# Patient Record
Sex: Female | Born: 1970 | Race: Black or African American | Hispanic: No | Marital: Married | Smoking: Never smoker
Health system: Southern US, Community
[De-identification: ages and names within clinical notes are randomized; demographics above are authoritative.]

## PROBLEM LIST (undated history)

## (undated) DIAGNOSIS — E785 Hyperlipidemia, unspecified: Secondary | ICD-10-CM

## (undated) DIAGNOSIS — R7303 Prediabetes: Secondary | ICD-10-CM

## (undated) DIAGNOSIS — I10 Essential (primary) hypertension: Secondary | ICD-10-CM

## (undated) DIAGNOSIS — E611 Iron deficiency: Secondary | ICD-10-CM

## (undated) DIAGNOSIS — Z8742 Personal history of other diseases of the female genital tract: Secondary | ICD-10-CM

## (undated) DIAGNOSIS — Z8619 Personal history of other infectious and parasitic diseases: Secondary | ICD-10-CM

## (undated) DIAGNOSIS — B379 Candidiasis, unspecified: Secondary | ICD-10-CM

## (undated) HISTORY — DX: Candidiasis, unspecified: B37.9

## (undated) HISTORY — PX: SALPINGECTOMY: SHX328

## (undated) HISTORY — DX: Iron deficiency: E61.1

## (undated) HISTORY — DX: Personal history of other diseases of the female genital tract: Z87.42

## (undated) HISTORY — DX: Personal history of other infectious and parasitic diseases: Z86.19

## (undated) HISTORY — DX: Hyperlipidemia, unspecified: E78.5

## (undated) HISTORY — DX: Essential (primary) hypertension: I10

## (undated) HISTORY — DX: Prediabetes: R73.03

---

## 2001-02-08 ENCOUNTER — Other Ambulatory Visit: Admission: RE | Admit: 2001-02-08 | Discharge: 2001-02-08 | Payer: Self-pay | Admitting: Obstetrics and Gynecology

## 2004-10-02 ENCOUNTER — Encounter: Admission: RE | Admit: 2004-10-02 | Discharge: 2004-10-14 | Payer: Self-pay | Admitting: Family Medicine

## 2006-02-09 ENCOUNTER — Emergency Department (HOSPITAL_COMMUNITY): Admission: EM | Admit: 2006-02-09 | Discharge: 2006-02-09 | Payer: Self-pay | Admitting: Emergency Medicine

## 2006-05-17 ENCOUNTER — Other Ambulatory Visit: Admission: RE | Admit: 2006-05-17 | Discharge: 2006-05-17 | Payer: Self-pay | Admitting: *Deleted

## 2007-09-14 HISTORY — PX: BREAST REDUCTION SURGERY: SHX8

## 2007-10-24 ENCOUNTER — Inpatient Hospital Stay (HOSPITAL_COMMUNITY): Admission: AD | Admit: 2007-10-24 | Discharge: 2007-10-28 | Payer: Self-pay | Admitting: Obstetrics and Gynecology

## 2007-10-25 ENCOUNTER — Encounter (INDEPENDENT_AMBULATORY_CARE_PROVIDER_SITE_OTHER): Payer: Self-pay | Admitting: Obstetrics and Gynecology

## 2008-07-10 ENCOUNTER — Encounter: Admission: RE | Admit: 2008-07-10 | Discharge: 2008-07-10 | Payer: Self-pay | Admitting: Plastic Surgery

## 2008-07-31 ENCOUNTER — Ambulatory Visit (HOSPITAL_BASED_OUTPATIENT_CLINIC_OR_DEPARTMENT_OTHER): Admission: RE | Admit: 2008-07-31 | Discharge: 2008-07-31 | Payer: Self-pay | Admitting: Plastic Surgery

## 2008-07-31 ENCOUNTER — Encounter (INDEPENDENT_AMBULATORY_CARE_PROVIDER_SITE_OTHER): Payer: Self-pay | Admitting: Plastic Surgery

## 2010-01-28 ENCOUNTER — Emergency Department (HOSPITAL_COMMUNITY): Admission: EM | Admit: 2010-01-28 | Discharge: 2010-01-28 | Payer: Self-pay | Admitting: Family Medicine

## 2010-05-15 ENCOUNTER — Other Ambulatory Visit: Payer: Self-pay | Admitting: Obstetrics and Gynecology

## 2010-05-18 ENCOUNTER — Inpatient Hospital Stay (HOSPITAL_COMMUNITY): Admission: AD | Admit: 2010-05-18 | Discharge: 2010-05-21 | Payer: Self-pay | Admitting: Obstetrics and Gynecology

## 2010-07-10 ENCOUNTER — Emergency Department (HOSPITAL_COMMUNITY): Admission: EM | Admit: 2010-07-10 | Discharge: 2010-07-10 | Payer: Self-pay | Admitting: Family Medicine

## 2010-11-26 LAB — CBC
HCT: 31.1 % — ABNORMAL LOW (ref 36.0–46.0)
HCT: 32.8 % — ABNORMAL LOW (ref 36.0–46.0)
MCH: 27 pg (ref 26.0–34.0)
MCH: 27 pg (ref 26.0–34.0)
MCV: 81.8 fL (ref 78.0–100.0)
Platelets: 276 10*3/uL (ref 150–400)
Platelets: 282 10*3/uL (ref 150–400)
RBC: 3.97 MIL/uL (ref 3.87–5.11)
RBC: 4.07 MIL/uL (ref 3.87–5.11)
WBC: 6.6 10*3/uL (ref 4.0–10.5)
WBC: 7.9 10*3/uL (ref 4.0–10.5)

## 2010-11-26 LAB — COMPREHENSIVE METABOLIC PANEL
Albumin: 3.1 g/dL — ABNORMAL LOW (ref 3.5–5.2)
BUN: 7 mg/dL (ref 6–23)
CO2: 21 mEq/L (ref 19–32)
Chloride: 110 mEq/L (ref 96–112)
GFR calc Af Amer: >60 mL/min (ref >60)
Sodium: 139 mEq/L (ref 135–145)
Total Bilirubin: 0.2 mg/dL — ABNORMAL LOW (ref 0.3–1.2)
Total Protein: 6.4 g/dL (ref 6.0–8.3)

## 2010-11-26 LAB — URIC ACID: Uric Acid, Serum: 5 mg/dL (ref 2.4–7.0)

## 2010-11-26 LAB — SURGICAL PCR SCREEN: Staphylococcus aureus: NEGATIVE

## 2010-11-26 LAB — RPR: RPR Ser Ql: NONREACTIVE

## 2010-11-26 LAB — LACTATE DEHYDROGENASE: LDH: 154 U/L (ref 94–250)

## 2010-11-30 LAB — CULTURE, ROUTINE-ABSCESS

## 2011-01-26 NOTE — Op Note (Signed)
[name], [name]            ACCOUNT NO.:  [id number]   MEDICAL RECORD NO.:  [id number]          PATIENT TYPE:  INP   LOCATION:  9315                          FACILITY:  [facility]   PHYSICIAN:  Osborn Coho, M.D.   DATE OF BIRTH:  1970-11-29   DATE OF PROCEDURE:  10/25/2007  DATE OF DISCHARGE:                               OPERATIVE REPORT   PREOPERATIVE DIAGNOSES:  1. Intrauterine pregnancy at 38-5/7 weeks.  2. Spontaneous rupture of membranes.  3. Breech presentation.   POSTOPERATIVE DIAGNOSIS:  1. Intrauterine pregnancy at 38-5/7 weeks.  2. Spontaneous rupture of membranes.  3. Breech presentation.   PROCEDURE:  Primary low transverse C-section.   ANESTHESIA:  Spinal.   SURGEON:  Osborn Coho, M.D.   ASSISTANT:  Philipp Deputy, C.N.M.   FLUIDS:  2400 mL.   URINE OUTPUT:  300 mL of clear urine.   ESTIMATED BLOOD LOSS:  800 mL.   COMPLICATIONS:  None.   SPECIMENS TO PATHOLOGY:  Placenta.   FINDINGS:  Live female infant with Apgars of 9 at one minute and 10 at 5  minutes.  Normal-appearing bilateral ovaries and fallopian tubes.   PROCEDURE:  The patient is taken to the operating room after risks,  benefits, alternatives reviewed with the patient.  The patient  verbalized understanding and consent signed and witnessed.  The patient  was given a spinal per anesthesia and prepped and draped in normal  sterile fashion.  A Pfannenstiel skin incision was made and carried down  to the underlying layer of fascia with the scalpel and Bovie.  The  fascia was excised bilaterally in the midline, extended bilaterally with  the Mayo scissors.  Kocher clamps were placed on the inferior aspect of  fascial incision and the rectus muscle excised from the fascia.  The  same was done on the superior aspect of fascial incision.  The rectus  muscle was separated in midline and peritoneum entered bluntly and  extended manually.  Bladder blade was placed and bladder flap created  with the  Metzenbaum scissors.  Uterine incision was made with a scalpel  and extended bilaterally with the bandage scissors.  Infant was  delivered in frank breech presentation via breech extraction and nuchal  cord x1 noted.  Oropharynx was bulb suctioned.  Cord was clamped and cut  and infant handed to the waiting pediatricians.  Cord bloods were  collected.  The placenta was removed via fundal massage and uterus  cleared of all clots and debris.  The incision was repaired with 0  Vicryl in a running locked fashion.  A second imbricating layer was  performed.  The intra-abdominal cavity was copiously irrigated and the  uterine incision was noted to be hemostatic.  Normal appearing bilateral  ovaries and fallopian tubes noted.  The peritoneum was  repaired with 2-  0 chromic in a running fashion.  The fascia was repaired with 0 Vicryl  in a running fashion.  The subcutaneous tissue was irrigated and made  hemostatic with the Bovie and was reapproximated using four interrupted  stitches of 2-0 plain.  The skin was reapproximated using  3-0 Monocryl  via a subcuticular stitch.  Half-inch Steri-Strips were applied with  Benzoin.  Sponge, lap and needle count was correct.  The patient  tolerated procedure well and is awaiting transfer to the recovery room  in good condition.  October 25, 2007      Osborn Coho, M.D.  Electronically Signed     AR/MEDQ  D:  10/25/2007  T:  10/26/2007  Job:  [id number]

## 2011-01-26 NOTE — H&P (Signed)
Jody [name], Jody [name]            ACCOUNT NO.:  [id number]   MEDICAL RECORD NO.:  [id number]          PATIENT TYPE:  MAT   LOCATION:  MATC                          [facility]  [facility]   PHYSICIAN:  Osborn Coho, M.D.   DATE OF BIRTH:  Sep 05, 1971   DATE OF ADMISSION:  10/24/2007  DATE OF DISCHARGE:                              HISTORY & PHYSICAL   Jody [name] is a 40 year old married black female prima gravida at 66-  5/7 weeks who presents with leaking fluid since 11 p.m. on October 24, 2007.  She denies contractions or bleeding, no signs or symptoms of PIH.  She reports fetus being breach at her last office visit.  Her pregnancy  has been followed by [facility] MD service.  Has been  remarkable for:   1. Advanced maternal age.  2. First trimester medication exposure.  3. Group B strep negative.   Her prenatal labs were collected on April 14, 2007.  Hemoglobin 12,  hematocrit 36.3, platelets 319,000.  Blood group A  positive, antibody  negative, RPR nonreactive, rubella immune, hepatitis B surface antigen  negative, HIV nonreactive.  Pap smear within normal limits.  Quad screen  from May 09, 2007 was within normal limits.  1-hour Glucola from  August 03, 2007 was 155.  Hemoglobin at that time was 11.1.  Three  hour glucose tolerance test from August 14, 2007 was within normal  limits.  Culture of the vaginal tract for group B strep on October 03, 2007 was negative.   HISTORY OF PRESENT PREGNANCY:  Patient presented for care at [facility] on April 14, 2007 at 11-1/7 weeks' gestation.  She declined  amniocentesis as well as first trimester screen.  She did have a quad  screen drawn which was normal.  Anatomy ultrasound at 20 weeks'  gestation shows growth consistent with previous dating confirming [facility] of  November 03, 2007.  1-hour Glucola was elevated requiring a three hour  glucose tolerance test which was within normal limits.  At patient's  office  visit at 37-38 weeks' gestation, fetus was noted to be in the  breech presentation.  Rest of her prenatal care was unremarkable.   OBSTETRIC HISTORY:  She is a primigravida.   ALLERGIES:  She has no medication allergies.   GYNECOLOGICAL HISTORY:  She experienced menarche at the age of 68 with  28 day cycles lasting seven days.  She has used NuvaRing in the past for  contraception and discontinued that in January 2008.  She reports having  had the usual childhood illnesses.  She had cystitis in August 2006.   PAST SURGICAL HISTORY:  Negative.   FAMILY MEDICAL HISTORY:  Mother and maternal grandmother with chronic  hypertension, maternal grandmother with varicosities, mother with non-  insulin-dependent diabetes.   GENETIC HISTORY:  Father of the baby's mother is a twin.  Patient is at  the age of 75 at time of delivery.   SOCIAL HISTORY:  The patient is married to the father of the baby.  His  name is Jody [name].  He is involved and supportive.  They  are of the [facility]  faith.  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  Father of the baby has 21 years of education and is a  Scientist, physiological.  They deny any alcohol, tobacco or illicit drug use  at positive pregnancy test, although patient did take medication called  phentermine up until February 23, 2007.   OBJECTIVE DATA:  VITAL SIGNS:  Stable.  She is afebrile.  HEENT:  Grossly within normal limits.  CHEST:  Clear to auscultation.  HEART:  Regular rate and rhythm.  ABDOMEN:  Gravid in contour with fundal height extending approximately  39 cm above pubic symphysis.  Fetal heart rate is reactive and  reassuring.  There are no contractions.  PELVIC:  Deferred.  The patient is leaking copious clear fluid that is  positive for Nitrazine and positive for ferning.  EXTREMITIES:  Within normal limits.  Formal ultrasound shows complete  breech presentation with AFI of 3%.   ASSESSMENT:  1. Intrauterine  pregnancy at term.  2. Breech.  3. Premature rupture of membranes.   PLAN:  Admit to the operating room and prepare for cesarean section.  Questions have been answered, and risks and benefits were reviewed by  myself as well as Dr. Osborn Coho.      Cam Hai, C.N.M.      Osborn Coho, M.D.  Electronically Signed    KS/MEDQ  D:  10/25/2007  T:  10/25/2007  Job:  [id number]

## 2011-01-26 NOTE — Discharge Summary (Signed)
Jody [name], Jody [name]            ACCOUNT NO.:  [id number]   MEDICAL RECORD NO.:  [id number]          PATIENT TYPE:  INP   LOCATION:  9315                          [facility]ACILITY:  [facility]   PHYSICIAN:  Crist Fat. Rivard, M.D. DATE OF BIRTH:  03/05/1971   DATE OF ADMISSION:  10/24/2007  DATE OF DISCHARGE:  10/28/2007                               DISCHARGE SUMMARY   ADMISSION DIAGNOSES:  1. Intrauterine pregnancy at 109 and 5/7 weeks.  2. Spontaneous rupture of membranes.  3. Breech presentation.   DISCHARGE DIAGNOSES:  1. Intrauterine pregnancy at 25 and 5/7 weeks, delivered.  2. Breech presentation.  3. Primary low transverse cesarean section.   PROCEDURE:  Primary low transverse cesarean section.   HOSPITAL COURSE:  [name] is a 40 year old gravida 1, para 0, who  presents at 86 and 5/7 weeks with spontaneous ruptured membranes on  October 24, 2007.  Patient's fetus previously had been noted to be in  breech presentation.  Patient denied any signs or symptoms of PIH.  Patient's pregnancy had been remarkable for:  1. Advanced maternal age.  2. Third trimester med exposure.  3. GBS negative.   Upon admission fetal heart rate was noted to be reassuring.  Patient was  noted to have spontaneous rupture of membranes with clear fluid.  Ultrasound revealed complete breech presentation.  Due to breech  presentation, risks, benefits and alternatives of primary low transverse  cesarean section were discussed with the patient, the patient desired to  proceed.  Patient underwent a low transverse cesarean section and  delivered a viable female infant, named Fayrene Fearing, with Apgars of 9 and 10 at  1 and 5 minutes respectively and weight 6 pounds 9 ounces.  Estimated  blood loss 800 mL.  Surgery was uncomplicated.   On postoperative day #1, patient's hemoglobin was noted to be 10.8.  Patient was ambulating without difficulty.  By postoperative day #3,  patient continued to ambulate, void and  tolerate p.o. liquids and solids  without difficulty.  The patient working on breastfeeding with help of  lactation consultants.  Patient was felt to have received the full  benefit of her hospital stay and was therefore discharged home.  Patient  desires to use Mirena IUD for contraception which will be placed at 12  weeks postpartum.  Patient received Depo-Provera 150 mg IM prior to  discharge for contraception prior to placement of Mirena IUD.  Patient  was encouraged to call if she has questions or concerns prior to her 6-  week postpartum visit.   DISCHARGE INSTRUCTIONS:  Per [facility] handout.   DISCHARGE MEDICATIONS:  1. Motrin 600 mg p.o. q.6h. p.r.n. pain.  2. Tylox 1-2 tabs every 4-6h. p.r.n. for pain.  3. Prenatal vitamins 1 tab daily.   DISCHARGE FOLLOWUP:  Will occur in 6 weeks postpartum at [facility].      Rhona Leavens, CNM      Crist Fat Rivard, M.D.  Electronically Signed    NOS/MEDQ  D:  10/28/2007  T:  10/30/2007  Job:  [id number]

## 2011-01-26 NOTE — Op Note (Signed)
NAME:  Jody [name], Jody [name]            ACCOUNT NO.:  [id number]   MEDICAL RECORD NO.:  [id number]          PATIENT TYPE:  AMB   LOCATION:  DSC                          FACILITY:  [facility]   PHYSICIAN:  Brantley Persons, M.D.DATE OF BIRTH:  Nov 24, 1970   DATE OF PROCEDURE:  07/31/2008  DATE OF DISCHARGE:                               OPERATIVE REPORT   PREOPERATIVE DIAGNOSIS:  Bilateral macromastia.   POSTOPERATIVE DIAGNOSIS:  Bilateral macromastia.   PROCEDURE:  Bilateral reduction mammoplasties.   ATTENDING SURGEON:  Brantley Persons, MD   ANESTHESIA:  General.   ANESTHESIOLOGIST:  Judie Petit, MD   ESTIMATED BLOOD LOSS:  150 mL.   FLUID REPLACEMENT:  3750 mL.   URINE OUTPUT:  900 mL.   COMPLICATIONS:  None.   INDICATIONS FOR PROCEDURE:  The patient is a 40 year old African  American female who has bilateral macromastia that is clinically  symptomatic.  She presents to undergo bilateral reduction mammoplasties.   PROCEDURE IN DETAIL:  The patient was marked in the preop holding area  in the pattern of Wise for the future bilateral reduction mammoplasties.  She was then taken back to the OR and placed on the table in supine  position.  After adequate general anesthesia was obtained, the patient's  chest was prepped with Technicare and draped in sterile fashion.  The  base of breasts had been injected with 1% lidocaine with epinephrine.  After adequate hemostasis and anesthesia had taken effect, the procedure  was begun.   Both of the breast reductions were performed in the following similar  manner.  The nipple-areolar complexes were marked with the 45-mm nipple  marker.  This was then incised and skin was de-epithelialized around the  nipple-areolar complex and carried down to the inframammary crease in  inferior pedicle pattern.  Next the medial, superior, and lateral skin  flaps were elevated down to the chest wall.  The excess fat and  glandular tissue were removed  from the inferior pedicle.  The nipple-  areolar complex was examined and found to be pink and viable.  The wound  was irrigated with saline irrigation.  The inferior pedicle was then  centralized using 3-0 Prolene suture.  A #10 JP flat fully fluted drain  was placed into the wound.  The skin flaps were brought together at the  inverted T junction with a 2-0 Prolene suture.  The incisions were  stapled for temporary closure.  The breasts were compared and found to  have good shape and symmetry.  The incisions were then closed from the  medial aspect of the JP drain to the medial aspect of the [facility] incision,  first placing a few 3-0 Monocryl sutures in the dermal layer to loosely  tacked together and then both dermal and cuticular layer were closed in  a single layer using a Quill 2.0 PDO barbed suture.  Lateral to the JP  drain, the incision was closed using 3-0 Monocryl in the dermal layer  followed by 3-0 Monocryl running intracuticular stitch on the skin.   The patient was placed in the upright position.  The future location of  the nipple-areolar complexes was marked on both breast mounds using the  45-mm nipple marker.  The patient was then placed back in the recumbent  position.   Both of the nipple-areolar complexes were then brought out onto the  breast mound in the following similar manner.  The skin was incised as  marked and the tissue removed in full thickness into the subcutaneous  tissues.  The nipple-areolar complex was examined, found to be pink and  viable, then brought out through this aperture and sewn in place using 4-  0 Monocryl in the dermal layer followed by 5-0 Monocryl running  intracuticular stitch on the skin.  The vertical limb of the Wise  pattern had been closed in the dermal layer using a 3-0 Monocryl suture,  so then the cuticular layer was also closed with a 5-0 Monocryl in  continuity from the nipple-areolar complex stitch.  The drain was sewn  in  place using 3-0 nylon suture.  The skin and subcutaneous tissues in  the base of the breasts were then injected with 0.5% Marcaine with  epinephrine to provide a postsurgical anesthetic block.  The incisions  were dressed with benzoin and Steri-Strips and the nipples additionally  with bacitracin ointment and Adaptic.  There were no complications.  The  patient tolerated the procedure well.  The final needle and sponge  counts were reported to be correct at the end of the case.  A 4 x 4s  were placed over the incisions and ABD pads in the axillary areas.  The  patient was then placed into a light postoperative support bra.   The patient was then awakened from general anesthesia and taken to  recovery room in stable condition.  She was recovered without  complications.  The patient went to go home rather than stay overnight  and this was appropriate.  Both the patient and her family were given  proper postoperative wound care instructions including care of the JP  drains.  The patient was then discharged home in the care of her family  in stable condition.  Followup appointment will be Monday in the office.           ______________________________  Brantley Persons, M.D.     MC/MEDQ  D:  07/31/2008  T:  08/01/2008  Job:  [id number]

## 2011-06-04 LAB — CBC
HCT: 31.7 — ABNORMAL LOW
Hemoglobin: 9.7 — ABNORMAL LOW
MCV: 82.2
Platelets: 304
RDW: 14.5
WBC: 7.5

## 2011-06-15 LAB — POCT HEMOGLOBIN-HEMACUE: Hemoglobin: 12.1

## 2011-11-22 ENCOUNTER — Ambulatory Visit: Payer: Self-pay | Admitting: Obstetrics and Gynecology

## 2011-11-25 ENCOUNTER — Ambulatory Visit: Payer: Self-pay | Admitting: Obstetrics and Gynecology

## 2011-12-27 ENCOUNTER — Ambulatory Visit (INDEPENDENT_AMBULATORY_CARE_PROVIDER_SITE_OTHER): Payer: Managed Care, Other (non HMO) | Admitting: Obstetrics and Gynecology

## 2011-12-27 ENCOUNTER — Encounter: Payer: Self-pay | Admitting: Obstetrics and Gynecology

## 2011-12-27 VITALS — BP 110/60 | HR 70 | Resp 18 | Ht 69.0 in | Wt 208.0 lb

## 2011-12-27 DIAGNOSIS — Z1231 Encounter for screening mammogram for malignant neoplasm of breast: Secondary | ICD-10-CM

## 2011-12-27 DIAGNOSIS — K625 Hemorrhage of anus and rectum: Secondary | ICD-10-CM

## 2011-12-27 DIAGNOSIS — Z124 Encounter for screening for malignant neoplasm of cervix: Secondary | ICD-10-CM

## 2011-12-27 LAB — CBC
HCT: 39.2 % (ref 36.0–46.0)
Hemoglobin: 12.8 g/dL (ref 12.0–15.0)
MCV: 82.9 fL (ref 78.0–100.0)
WBC: 5.7 10*3/uL (ref 4.0–10.5)

## 2011-12-27 NOTE — Patient Instructions (Signed)
Rectal Bleeding  Rectal bleeding is when blood passes out of the anus. It is usually a sign that something is wrong. It may not be serious, but it should always be evaluated. Rectal bleeding may present as bright red blood or extremely dark stools. The color may range from dark red or maroon to black (like tar). It is important that the cause of rectal bleeding be identified so treatment can be started and the problem corrected.  CAUSES    Hemorrhoids. These are enlarged (dilated) blood vessels or veins in the anal or rectal area.   Fistulas. Theseare abnormal, burrowing channels that usually run from inside the rectum to the skin around the anus. They can bleed.   Anal fissures. This is a tear in the tissue of the anus. Bleeding occurs with bowel movements.   Diverticulosis. This is a condition in which pockets or sacs project from the bowel wall. Occasionally, the sacs can bleed.   Diverticulitis. Thisis an infection involving diverticulosis of the colon.   Proctitis and colitis. These are conditions in which the rectum, colon, or both, can become inflamed and pitted (ulcerated).   Polyps and cancer. Polyps are non-cancerous (benign) growths in the colon that may bleed. Certain types of polyps turn into cancer.   Protrusion of the rectum. Part of the rectum can project from the anus and bleed.   Certain medicines.   Intestinal infections.   Blood vessel abnormalities.  HOME CARE INSTRUCTIONS   Eat a high-fiber diet to keep your stool soft.   Limit activity.   Drink enough fluids to keep your urine clear or pale yellow.   Warm baths may be useful to soothe rectal pain.   Follow up with your caregiver as directed.  SEEK IMMEDIATE MEDICAL CARE IF:   You develop increased bleeding.   You have black or dark red stools.   You vomit blood or material that looks like coffee grounds.   You have abdominal pain or tenderness.   You have a fever.   You feel weak, nauseous, or you faint.   You have  severe rectal pain or you are unable to have a bowel movement.  MAKE SURE YOU:   Understand these instructions.   Will watch your condition.   Will get help right away if you are not doing well or get worse.  Document Released: 02/19/2002 Document Revised: 08/19/2011 Document Reviewed: 02/14/2011  Exit[employer] Patient Information 2012 [employer].

## 2011-12-27 NOTE — Progress Notes (Signed)
Contraception iud MIRENA placed 11/11 Last pap [id number] Last Mammo11/2009  Last Colonoscopy 2009  Last Dexa Scan  Primary MD Velna Hatchet  Abuse at Home no  Pt complains bright red blood per rectum in the last few months.  She has history of anal fissure.  She denies any abdominal pain, N/v, or history of colon cancer.  Pt had a colonoscopy 2009 which was normal per pt Physical Examination: General appearance - alert, well appearing, and in no distress and oriented to person, place, and time Neck - supple, no significant adenopathy Chest - clear to auscultation, no wheezes, rales or rhonchi, symmetric air entry Heart - normal rate, regular rhythm, normal S1, S2, no murmurs, rubs, clicks or gallops Abdomen - soft, nontender, nondistended, no masses or organomegaly Breasts - breasts appear normal, no suspicious masses, no skin or nipple changes or axillary nodes Pelvic - normal external genitalia, vulva, vagina, cervix, uterus and adnexa.  Iud strings visualized Rectal - normal rectal, no masses,  Back exam - full range of motion, no tenderness, palpable spasm or pain on motion guiac negative  Rectal bleeding AEX mirena IUD in place Follow up with Dr Allyne Gee.   Mammogram scheduled Pap sent Cbc done  EXT NOCCEB Neurological - alert, oriented, normal speech, no focal findings or movement disorder noted

## 2011-12-28 ENCOUNTER — Encounter: Payer: Self-pay | Admitting: Obstetrics and Gynecology

## 2011-12-28 DIAGNOSIS — K625 Hemorrhage of anus and rectum: Secondary | ICD-10-CM | POA: Insufficient documentation

## 2011-12-30 LAB — PAP IG, CT-NG, RFX HPV ASCU
Chlamydia Probe Amp: NEGATIVE
GC Probe Amp: NEGATIVE

## 2012-03-06 ENCOUNTER — Emergency Department (HOSPITAL_COMMUNITY)
Admission: EM | Admit: 2012-03-06 | Discharge: 2012-03-06 | Disposition: A | Discharge status: Home / Self Care | Payer: Managed Care, Other (non HMO) | Attending: Emergency Medicine | Admitting: Emergency Medicine

## 2012-03-06 ENCOUNTER — Encounter (HOSPITAL_COMMUNITY): Payer: Self-pay | Admitting: *Deleted

## 2012-03-06 DIAGNOSIS — S0502XA Injury of conjunctiva and corneal abrasion without foreign body, left eye, initial encounter: Secondary | ICD-10-CM

## 2012-03-06 DIAGNOSIS — S0500XA Injury of conjunctiva and corneal abrasion without foreign body, unspecified eye, initial encounter: Secondary | ICD-10-CM | POA: Insufficient documentation

## 2012-03-06 DIAGNOSIS — IMO0002 Reserved for concepts with insufficient information to code with codable children: Secondary | ICD-10-CM | POA: Insufficient documentation

## 2012-03-06 DIAGNOSIS — S058X9A Other injuries of unspecified eye and orbit, initial encounter: Secondary | ICD-10-CM | POA: Insufficient documentation

## 2012-03-06 DIAGNOSIS — I10 Essential (primary) hypertension: Secondary | ICD-10-CM | POA: Insufficient documentation

## 2012-03-06 DIAGNOSIS — Y92009 Unspecified place in unspecified non-institutional (private) residence as the place of occurrence of the external cause: Secondary | ICD-10-CM | POA: Insufficient documentation

## 2012-03-06 MED ORDER — TOBRAMYCIN 0.3 % OP OINT
TOPICAL_OINTMENT | Freq: Four times a day (QID) | OPHTHALMIC | Status: DC
  Administered 2012-03-06: 09:00:00 via OPHTHALMIC
  Filled 2012-03-06: qty 3.5

## 2012-03-06 MED ORDER — KETOROLAC TROMETHAMINE 0.4 % OP SOLN: 1.0000 [drp] | Freq: Four times a day (QID) | OPHTHALMIC | Status: DC

## 2012-03-06 MED ORDER — FLUORESCEIN SODIUM 1 MG OP STRP
ORAL_STRIP | OPHTHALMIC | Status: AC
  Administered 2012-03-06: 08:00:00 via TOPICAL
  Filled 2012-03-06: qty 1

## 2012-03-06 MED ORDER — TETRACAINE HCL 0.5 % OP SOLN
OPHTHALMIC | Status: AC
  Administered 2012-03-06: 08:00:00 via OPHTHALMIC
  Filled 2012-03-06: qty 2

## 2012-03-06 NOTE — ED Provider Notes (Signed)
History     CSN: [id number]  Arrival date & time 03/06/12  4098   First MD Initiated Contact with Patient 03/06/12 [phone]      Chief Complaint  Patient presents with  . Eye Pain    (Consider location/radiation/quality/duration/timing/severity/associated sxs/prior treatment) HPI Comments: A 41 year old woman who is putting her daughter to bed. Was playing with a fork and accidentally struck the mother in her left eye. She now has pain in the left eye foreign body sensation. Her vision is hazy.  Patient is a 41 y.o. female presenting with eye pain. The history is provided by the patient.  Eye Pain This is a new problem. The current episode started 12 to 24 hours ago. The problem occurs constantly. The problem has not changed since onset.Nothing aggravates the symptoms. Nothing relieves the symptoms. She has tried nothing for the symptoms.    Past Medical History  Diagnosis Date  . History of chicken pox   . Hx of mumps   . Low iron   . Yeast infection   . H/O amenorrhea   . Hypertension     Past Surgical History  Procedure Date  . Breast reduction surgery 2009    bilateral  . Cesarean section     Family History  Problem Relation Age of Onset  . Hypertension Mother   . Diabetes Mother   . Hypertension Maternal Grandmother     History  Substance Use Topics  . Smoking status: Never Smoker   . Smokeless tobacco: Not on file  . Alcohol Use: 0.5 oz/week    1 drink(s) per week    OB History    Grav Para Term Preterm Abortions TAB SAB Ect Mult Living                  Review of Systems  Eyes: Positive for pain.  All other systems reviewed and are negative.    Allergies  Review of patient's allergies indicates no known allergies.  Home Medications   Current Outpatient Rx  Name Route Sig Dispense Refill  . LISINOPRIL-HYDROCHLOROTHIAZIDE 20-12.5 MG PO TABS Oral Take 1 tablet by mouth daily.    Marland Kitchen PHENTERMINE HCL 37.5 MG PO CAPS Oral Take 18.75 mg by mouth  daily.      BP 114/87  Pulse 71  Temp 98.2 F (36.8 C) (Oral)  Resp 18  SpO2 100%  Physical Exam  Nursing note and vitals reviewed. Constitutional: She appears well-developed and well-nourished. No distress.  HENT:  Head: Normocephalic and atraumatic.  Right Ear: External ear normal.  Left Ear: External ear normal.  Mouth/Throat: Oropharynx is clear and moist.  Eyes: Pupils are equal, round, and reactive to light. Left eye exhibits chemosis. Left eye exhibits no discharge and no exudate. No foreign body present in the left eye. Left conjunctiva is injected. Right eye exhibits normal extraocular motion and no nystagmus. Left eye exhibits normal extraocular motion and no nystagmus.  Slit lamp exam:      The right eye shows no corneal abrasion.       The left eye shows corneal abrasion and fluorescein uptake. The left eye shows no corneal flare, no corneal ulcer and no foreign body.      ED Course  Procedures (including critical care time)  DISP:  Tobrex eye ointment qid.  Accular eye drops prn pain.    1. Injury of conjunctiva and corneal abrasion of left eye without foreign body  Carleene Cooper III, MD 03/06/12 [phone]

## 2012-03-06 NOTE — ED Notes (Signed)
Patient was poked in left eye with a fork.. Pt has had increase in pain and irritation to same.

## 2012-03-06 NOTE — Discharge Instructions (Signed)
Corneal Abrasion The cornea is the clear covering at the front and center of the eye. When looking at the colored portion (iris) of the eye, you are looking through that person's cornea.  This very thin tissue is made up of many layers. The surface layer is a single layer of cells called the corneal epithelium. This is one of the most sensitive tissues in the body. If a scratch or injury causes the corneal epithelium to come off, it is called a corneal abrasion. If the injury extends to the tissues below the epithelium, the condition is called a corneal ulcer.  CAUSES   Scratches.   Trauma.   Foreign body in the eye.   Some people have recurrences of abrasions in the area of the original injury even after they heal. This is called recurrent erosion syndrome. Recurrent erosion syndromes generally improve and go away with time.  SYMPTOMS   Eye pain.   Difficulty or inability to keep the injured eye open.   The eye becomes very sensitive to light.   Recurrent erosions tend to happen suddenly, first thing in the morning - usually upon awakening and opening the eyes.  DIAGNOSIS  Your eye professional can diagnose a corneal abrasion during an eye exam. Dye is usually placed in the eye using a drop or a small paper strip moistened by the patient's tears. When the eye is examined with a special light, the abrasion shows up clearly because of the dye. TREATMENT   Small abrasions may be treated with antibiotic drops or ointment alone.   Usually a pressure patch is specially applied. Pressure patches prevent the eye from blinking, allowing the corneal epithelium to heal. Because blinking is less, a pressure patch also reduces the amount of pain present in the eye during healing. Most corneal abrasions heal within 2-3 days with no effect on vision. WARNING: Do not drive or operate machinery while your eye is patched. Your ability to judge distances is impaired.   If abrasion becomes infected and  spreads to the deeper tissues of the cornea, a corneal ulcer can result. This is serious because it can cause corneal scarring. Corneal scars interfere with light passing through the cornea, and cause a loss of vision in the involved eye.   If your caregiver has given you a follow-up appointment, it is very important to keep that appointment. Not keeping the appointment could result in a severe eye infection or permanent loss of vision. If there is any problem keeping the appointment, you must call back to this facility for assistance.  SEEK MEDICAL CARE IF:   You have pain, light sensitivity and a scratchy feeling in one eye (or both).   Your pressure patch keeps loosening up and you can blink your eye under the patch after treatment.   Any kind of discharge develops from the involved eye after treatment or if the lids stick together in the morning.   You have the same symptoms in the morning as you did with the original abrasion days, weeks or months after the abrasion healed.  MAKE SURE YOU:   Understand these instructions.   Will watch your condition.   Will get help right away if you are not doing well or get worse.  Document Released: 08/27/2000 Document Revised: 08/19/2011 Document Reviewed: 04/04/2008 [facility] Patient Information 2012 [employer]   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  PUT TOBREX EYE OINTMENT IN THE LEFT EYE FOUR TIMES PER DAY; THIS WILL  LUBRICATE THE SURFACE OF YOUR EYE AND PREVENT INFECTION.  PUT ONE DROP OF ACULAR EYE DROPS IN THE LEFT EYE FOUR TIMES PER DAY TO RELIEVE PAIN.

## 2012-04-14 ENCOUNTER — Emergency Department (INDEPENDENT_AMBULATORY_CARE_PROVIDER_SITE_OTHER)
Admission: EM | Admit: 2012-04-14 | Discharge: 2012-04-14 | Disposition: A | Discharge status: Home / Self Care | Payer: Managed Care, Other (non HMO) | Source: Home / Self Care | Attending: Emergency Medicine | Admitting: Emergency Medicine

## 2012-04-14 ENCOUNTER — Encounter (HOSPITAL_COMMUNITY): Payer: Self-pay | Admitting: Emergency Medicine

## 2012-04-14 DIAGNOSIS — B9789 Other viral agents as the cause of diseases classified elsewhere: Secondary | ICD-10-CM

## 2012-04-14 DIAGNOSIS — J029 Acute pharyngitis, unspecified: Secondary | ICD-10-CM

## 2012-04-14 DIAGNOSIS — B349 Viral infection, unspecified: Secondary | ICD-10-CM

## 2012-04-14 MED ORDER — FLUTICASONE PROPIONATE 50 MCG/ACT NA SUSP: 2.0000 | Freq: Every day | NASAL | Status: DC

## 2012-04-14 MED ORDER — IBUPROFEN 600 MG PO TABS: 600.0000 mg | ORAL_TABLET | Freq: Three times a day (TID) | ORAL | Status: DC | PRN

## 2012-04-14 MED ORDER — HYDROCODONE-ACETAMINOPHEN 7.5-500 MG/15ML PO SOLN: 5.0000 mL | Freq: Four times a day (QID) | ORAL | Status: AC | PRN

## 2012-04-14 MED ORDER — GUAIFENESIN ER 600 MG PO TB12: 1200.0000 mg | ORAL_TABLET | Freq: Two times a day (BID) | ORAL | Status: DC

## 2012-04-14 NOTE — ED Notes (Signed)
Given ice water.

## 2012-04-14 NOTE — ED Notes (Signed)
C/o sore throat and headache, fever, aching.  Onset 2 days ago.

## 2012-04-15 NOTE — ED Provider Notes (Signed)
History     CSN: [id number]  Arrival date & time 04/14/12  1826   First MD Initiated Contact with Patient 04/14/12 1919      Chief Complaint  Patient presents with  . Sore Throat    (Consider location/radiation/quality/duration/timing/severity/associated sxs/prior treatment) HPI Comments: SORE THROAT  Onset: 2 days    Severity: moderate Tried OTC meds without significant relief.  Symptoms:  + Fever tmax 100.8 + Swollen neck glands    + rhinorrhea, post nasal drainage No Cough + Myalgias + Headache No Rash     No Recent Strep Exposure No Abdominal Pain No reflux sxs No Allergy sxs  No Breathing difficulty No Drooling No Trismus No sensation of throat swelling shut no voice changes  ROS as noted in HPI. All other ROS negative.    Patient is a 41 y.o. female presenting with pharyngitis. The history is provided by the patient. No language interpreter was used.  Sore Throat This is a new problem. The current episode started 2 days ago. The problem occurs constantly. Associated symptoms include headaches. Pertinent negatives include no chest pain, no abdominal pain and no shortness of breath. The symptoms are aggravated by swallowing. Nothing relieves the symptoms.    Past Medical History  Diagnosis Date  . History of chicken pox   . Hx of mumps   . Low iron   . Yeast infection   . H/O amenorrhea   . Hypertension     Past Surgical History  Procedure Date  . Breast reduction surgery 2009    bilateral  . Cesarean section     Family History  Problem Relation Age of Onset  . Hypertension Mother   . Diabetes Mother   . Hypertension Maternal Grandmother     History  Substance Use Topics  . Smoking status: Never Smoker   . Smokeless tobacco: Not on file  . Alcohol Use: 0.5 oz/week    1 drink(s) per week    OB History    Grav Para Term Preterm Abortions TAB SAB Ect Mult Living                  Review of Systems  Respiratory: Negative for  shortness of breath.   Cardiovascular: Negative for chest pain.  Gastrointestinal: Negative for abdominal pain.  Neurological: Positive for headaches.    Allergies  Review of patient's allergies indicates no known allergies.  Home Medications   Current Outpatient Rx  Name Route Sig Dispense Refill  . LEVONORGESTREL 20 MCG/24HR IU IUD Intrauterine 1 each by Intrauterine route once.    Marland Kitchen FLUTICASONE PROPIONATE 50 MCG/ACT NA SUSP Nasal Place 2 sprays into the nose daily. 16 g 0  . GUAIFENESIN ER 600 MG PO TB12 Oral Take 2 tablets (1,200 mg total) by mouth 2 (two) times daily. 28 tablet 0  . HYDROCODONE-ACETAMINOPHEN 7.5-500 MG/15ML PO SOLN Oral Take 5 mLs by mouth every 6 (six) hours as needed for pain. 120 mL 0  . IBUPROFEN 600 MG PO TABS Oral Take 1 tablet (600 mg total) by mouth every 8 (eight) hours as needed for pain, fever or headache. 30 tablet 0  . LISINOPRIL-HYDROCHLOROTHIAZIDE 20-12.5 MG PO TABS Oral Take 1 tablet by mouth daily.      BP 125/79  Pulse 85  Temp 100.8 F (38.2 C) (Oral)  Resp 18  SpO2 98%  Physical Exam  Nursing note and vitals reviewed. Constitutional: She appears well-developed and well-nourished.  HENT:  Head: Normocephalic and atraumatic.  Right  Ear: Tympanic membrane and ear canal normal.  Left Ear: Tympanic membrane and ear canal normal.  Nose: Mucosal edema and rhinorrhea present. No epistaxis.  Mouth/Throat: Uvula is midline and mucous membranes are normal. Posterior oropharyngeal erythema present. No oropharyngeal exudate.       (-) frontal, maxillary sinus tenderness  Eyes: Conjunctivae and EOM are normal.  Neck: Normal range of motion. Neck supple.  Cardiovascular: Normal rate.   Pulmonary/Chest: Effort normal and breath sounds normal.  Abdominal: She exhibits no distension.  Musculoskeletal: Normal range of motion.  Lymphadenopathy:    She has no cervical adenopathy.  Neurological: She is alert. Coordination normal.  Skin: Skin is warm  and dry. No rash noted.  Psychiatric: She has a normal mood and affect. Her behavior is normal. Judgment and thought content normal.    ED Course  Procedures (including critical care time)   Labs Reviewed  POCT RAPID STREP A ([facility] CARE ONLY)  LAB REPORT - SCANNED   No results found.   1. Pharyngitis with viral syndrome      MDM  Rapid strep negative.  Discussed this with patient. Patient home with ibuprofen, Tylenol. Patient to followup with PMD when necessary, will refer to local primary care resources.   Luiz Blare, MD 04/15/12 1120

## 2012-05-01 LAB — HM COLONOSCOPY

## 2012-09-13 DIAGNOSIS — T8331XA Breakdown (mechanical) of intrauterine contraceptive device, initial encounter: Secondary | ICD-10-CM | POA: Insufficient documentation

## 2012-11-02 ENCOUNTER — Encounter: Payer: Self-pay | Admitting: Certified Nurse Midwife

## 2012-11-02 ENCOUNTER — Ambulatory Visit: Payer: Managed Care, Other (non HMO) | Admitting: Certified Nurse Midwife

## 2012-11-02 VITALS — BP 130/72 | Temp 99.0°F | Wt 214.0 lb

## 2012-11-02 DIAGNOSIS — R319 Hematuria, unspecified: Secondary | ICD-10-CM

## 2012-11-02 DIAGNOSIS — N390 Urinary tract infection, site not specified: Secondary | ICD-10-CM

## 2012-11-02 LAB — POCT URINALYSIS DIPSTICK
Glucose, UA: NEGATIVE
Ketones, UA: NEGATIVE
Spec Grav, UA: 1.02

## 2012-11-02 MED ORDER — NITROFURANTOIN MONOHYD MACRO 100 MG PO CAPS: 100.0000 mg | ORAL_CAPSULE | Freq: Two times a day (BID) | ORAL | Status: AC

## 2012-11-02 MED ORDER — TERCONAZOLE 80 MG VA SUPP: 80.0000 mg | Freq: Every day | VAGINAL | Status: DC

## 2012-11-02 NOTE — Progress Notes (Signed)
Pt. C/o "uti" symptoms Onset of frequency , urgency and spasms at the end of voiding Denies fever, N/V/D, no pelvic pain or vag. D/c Has Mirena IUD-No cycles with IUD-Happy with this method  O- No CVAT  Abd: Soft and non tender, positive supra pubic tenderness, No RBT   A- Urine dip.-Pos. Nitrites and blood, trace protein   P- UC & S  Avoid caffeine  Increase H20

## 2012-11-04 LAB — URINE CULTURE: Colony Count: >=100000

## 2012-11-10 ENCOUNTER — Telehealth: Payer: Self-pay | Admitting: Obstetrics and Gynecology

## 2012-11-10 NOTE — Telephone Encounter (Signed)
Message copied by Mason Jim on Fri Nov 10, 2012  2:56 PM ------      Message from: Willaim Sheng      Created: Fri Nov 10, 2012 10:31 AM       Please notify pt. Of results      Macrobid one po bid x 7      Diflucan 150 mgs one po prn yeast infection ------

## 2012-11-10 NOTE — Telephone Encounter (Signed)
TC to pt. States was prescribed antibiotic at appt that she took x 7 days. Also was treated for yeast infection. Will call with name of Rx on Monday. CA notified.

## 2012-11-10 NOTE — Telephone Encounter (Signed)
Message copied by Mason Jim on Fri Nov 10, 2012 12:28 PM ------      Message from: Willaim Sheng      Created: Fri Nov 10, 2012 10:31 AM       Please notify pt. Of results      Macrobid one po bid x 7      Diflucan 150 mgs one po prn yeast infection ------

## 2013-03-09 ENCOUNTER — Inpatient Hospital Stay (HOSPITAL_COMMUNITY)
Admission: AD | Admit: 2013-03-09 | Discharge: 2013-03-10 | Disposition: A | Discharge status: Home / Self Care | Payer: Managed Care, Other (non HMO) | Source: Ambulatory Visit | Attending: Obstetrics and Gynecology | Admitting: Obstetrics and Gynecology

## 2013-03-09 ENCOUNTER — Other Ambulatory Visit: Payer: Self-pay

## 2013-03-09 ENCOUNTER — Inpatient Hospital Stay (HOSPITAL_COMMUNITY): Payer: Managed Care, Other (non HMO)

## 2013-03-09 ENCOUNTER — Ambulatory Visit: Admit: 2013-03-09 | Payer: Self-pay | Admitting: Obstetrics and Gynecology

## 2013-03-09 ENCOUNTER — Encounter (HOSPITAL_COMMUNITY): Payer: Self-pay

## 2013-03-09 ENCOUNTER — Encounter (HOSPITAL_COMMUNITY)
Admission: AD | Disposition: A | Discharge status: Home / Self Care | Payer: Self-pay | Source: Ambulatory Visit | Attending: Obstetrics and Gynecology

## 2013-03-09 DIAGNOSIS — K661 Hemoperitoneum: Secondary | ICD-10-CM

## 2013-03-09 DIAGNOSIS — K625 Hemorrhage of anus and rectum: Secondary | ICD-10-CM

## 2013-03-09 DIAGNOSIS — Z3009 Encounter for other general counseling and advice on contraception: Secondary | ICD-10-CM

## 2013-03-09 DIAGNOSIS — Z302 Encounter for sterilization: Secondary | ICD-10-CM | POA: Insufficient documentation

## 2013-03-09 DIAGNOSIS — O00109 Unspecified tubal pregnancy without intrauterine pregnancy: Secondary | ICD-10-CM | POA: Insufficient documentation

## 2013-03-09 DIAGNOSIS — R102 Pelvic and perineal pain unspecified side: Secondary | ICD-10-CM | POA: Diagnosis present

## 2013-03-09 DIAGNOSIS — N9489 Other specified conditions associated with female genital organs and menstrual cycle: Secondary | ICD-10-CM

## 2013-03-09 DIAGNOSIS — Z30432 Encounter for removal of intrauterine contraceptive device: Secondary | ICD-10-CM | POA: Insufficient documentation

## 2013-03-09 HISTORY — PX: LAPAROSCOPY: SHX197

## 2013-03-09 LAB — CBC WITH DIFFERENTIAL/PLATELET
Basophils Absolute: 0 10*3/uL (ref 0.0–0.1)
Lymphocytes Relative: 22 % (ref 12–46)
Neutro Abs: 8 10*3/uL — ABNORMAL HIGH (ref 1.7–7.7)
Platelets: 317 10*3/uL (ref 150–400)
RDW: 13.4 % (ref 11.5–15.5)
WBC: 10.7 10*3/uL — ABNORMAL HIGH (ref 4.0–10.5)

## 2013-03-09 LAB — HCG, QUANTITATIVE, PREGNANCY: hCG, Beta Chain, Quant, S: 362 m[IU]/mL — ABNORMAL HIGH (ref <5)

## 2013-03-09 LAB — ABO/RH: ABO/RH(D): A POS

## 2013-03-09 SURGERY — LAPAROSCOPY OPERATIVE
Anesthesia: General | Laterality: Left

## 2013-03-09 SURGERY — LAPAROSCOPY OPERATIVE
Anesthesia: General | Site: Abdomen | Laterality: Left | Wound class: Clean Contaminated

## 2013-03-09 MED ORDER — MIDAZOLAM HCL 5 MG/5ML IJ SOLN
INTRAMUSCULAR | Status: DC | PRN
  Administered 2013-03-09: 2 mg via INTRAVENOUS

## 2013-03-09 MED ORDER — 0.9 % SODIUM CHLORIDE (POUR BTL) OPTIME
TOPICAL | Status: DC | PRN
  Administered 2013-03-09: 1000 mL

## 2013-03-09 MED ORDER — BUPIVACAINE HCL (PF) 0.25 % IJ SOLN
INTRAMUSCULAR | Status: DC | PRN
  Administered 2013-03-09: 10 mL

## 2013-03-09 MED ORDER — ROCURONIUM BROMIDE 100 MG/10ML IV SOLN
INTRAVENOUS | Status: DC | PRN
  Administered 2013-03-09: 40 mg via INTRAVENOUS
  Administered 2013-03-09: 10 mg via INTRAVENOUS
  Administered 2013-03-09: 5 mg via INTRAVENOUS

## 2013-03-09 MED ORDER — CITRIC ACID-SODIUM CITRATE 334-500 MG/5ML PO SOLN
30.0000 mL | Freq: Once | ORAL | Status: AC
  Administered 2013-03-09: 30 mL via ORAL
  Filled 2013-03-09: qty 15

## 2013-03-09 MED ORDER — CEFAZOLIN SODIUM-DEXTROSE 2-3 GM-% IV SOLR
INTRAVENOUS | Status: DC | PRN
  Administered 2013-03-09: 2 g via INTRAVENOUS

## 2013-03-09 MED ORDER — ONDANSETRON HCL 4 MG/2ML IJ SOLN
INTRAMUSCULAR | Status: DC | PRN
  Administered 2013-03-09: 4 mg via INTRAVENOUS

## 2013-03-09 MED ORDER — DEXAMETHASONE SODIUM PHOSPHATE 4 MG/ML IJ SOLN
INTRAMUSCULAR | Status: DC | PRN
  Administered 2013-03-09: 8 mg via INTRAVENOUS

## 2013-03-09 MED ORDER — FAMOTIDINE IN NACL 20-0.9 MG/50ML-% IV SOLN
20.0000 mg | Freq: Once | INTRAVENOUS | Status: AC
  Administered 2013-03-09: 20 mg via INTRAVENOUS
  Filled 2013-03-09: qty 50

## 2013-03-09 MED ORDER — KETOROLAC TROMETHAMINE 30 MG/ML IJ SOLN
INTRAMUSCULAR | Status: DC | PRN
  Administered 2013-03-09: 30 mg via INTRAMUSCULAR
  Administered 2013-03-09: 30 mg via INTRAVENOUS

## 2013-03-09 MED ORDER — LIDOCAINE HCL (CARDIAC) 20 MG/ML IV SOLN
INTRAVENOUS | Status: DC | PRN
  Administered 2013-03-09: 40 mg via INTRAVENOUS

## 2013-03-09 MED ORDER — PROPOFOL 10 MG/ML IV BOLUS
INTRAVENOUS | Status: DC | PRN
  Administered 2013-03-09: 150 mg via INTRAVENOUS

## 2013-03-09 MED ORDER — LACTATED RINGERS IV SOLN
INTRAVENOUS | Status: DC
  Administered 2013-03-09 (×3): via INTRAVENOUS

## 2013-03-09 MED ORDER — FENTANYL CITRATE 0.05 MG/ML IJ SOLN
INTRAMUSCULAR | Status: DC | PRN
  Administered 2013-03-09 (×2): 100 ug via INTRAVENOUS
  Administered 2013-03-09: 50 ug via INTRAVENOUS
  Administered 2013-03-09: 100 ug via INTRAVENOUS

## 2013-03-09 SURGICAL SUPPLY — 34 items
ADH SKN CLS APL DERMABOND .7 (GAUZE/BANDAGES/DRESSINGS) ×2
ADH SKN CLS LQ APL DERMABOND (GAUZE/BANDAGES/DRESSINGS) ×1
BAG SPEC RTRVL LRG 6X4 10 (ENDOMECHANICALS) ×1
CABLE HIGH FREQUENCY MONO STRZ (ELECTRODE) ×1 IMPLANT
CHLORAPREP W/TINT 26ML (MISCELLANEOUS) ×2 IMPLANT
CLOTH BEACON ORANGE TIMEOUT ST (SAFETY) ×2 IMPLANT
CONTAINER PREFILL 10% NBF 60ML (FORM) ×1 IMPLANT
DERMABOND ADHESIVE PROPEN (GAUZE/BANDAGES/DRESSINGS) ×1
DERMABOND ADVANCED (GAUZE/BANDAGES/DRESSINGS) ×2
DERMABOND ADVANCED .7 DNX12 (GAUZE/BANDAGES/DRESSINGS) ×2 IMPLANT
DERMABOND ADVANCED .7 DNX6 (GAUZE/BANDAGES/DRESSINGS) IMPLANT
DRESSING TELFA 8X3 (GAUZE/BANDAGES/DRESSINGS) IMPLANT
DRSG VASELINE 3X18 (GAUZE/BANDAGES/DRESSINGS) ×2 IMPLANT
GLOVE SURG SS PI 6.5 STRL IVOR (GLOVE) ×4 IMPLANT
GOWN PREVENTION PLUS LG XLONG (DISPOSABLE) ×4 IMPLANT
NS IRRIG 1000ML POUR BTL (IV SOLUTION) ×2 IMPLANT
PACK LAPAROSCOPY BASIN (CUSTOM PROCEDURE TRAY) ×2 IMPLANT
POUCH SPECIMEN RETRIEVAL 10MM (ENDOMECHANICALS) ×2 IMPLANT
PROTECTOR NERVE ULNAR (MISCELLANEOUS) ×1 IMPLANT
SCALPEL HARMONIC ACE (MISCELLANEOUS) IMPLANT
SET IRRIG TUBING LAPAROSCOPIC (IRRIGATION / IRRIGATOR) IMPLANT
STRIP CLOSURE SKIN 1/4X3 (GAUZE/BANDAGES/DRESSINGS) IMPLANT
SUT MNCRL AB 4-0 PS2 18 (SUTURE) ×1 IMPLANT
SUT VIC AB 3-0 PS2 18 (SUTURE)
SUT VIC AB 3-0 PS2 18XBRD (SUTURE) IMPLANT
SUT VICRYL 0 ENDOLOOP (SUTURE) IMPLANT
SUT VICRYL 0 UR6 27IN ABS (SUTURE) ×6 IMPLANT
SYR 50ML LL SCALE MARK (SYRINGE) IMPLANT
TOWEL OR 17X24 6PK STRL BLUE (TOWEL DISPOSABLE) ×4 IMPLANT
TRAY FOLEY CATH 14FR (SET/KITS/TRAYS/PACK) ×2 IMPLANT
TROCAR BALL TOP DISP 5MM (ENDOMECHANICALS) IMPLANT
TROCAR XCEL DIL TIP R 11M (ENDOMECHANICALS) ×1 IMPLANT
WARMER LAPAROSCOPE (MISCELLANEOUS) ×2 IMPLANT
WATER STERILE IRR 1000ML POUR (IV SOLUTION) IMPLANT

## 2013-03-09 NOTE — MAU Note (Signed)
BROUGHT BACK TO RM 10-  FROM LOBBY.  PT HAS BEEN TO OFFICE TODAY  FOR PAIN ON HER LEFT SIDE-  FELT LIKE CRAMPS.  AND SENT HERE FOR LABS AND U/S.    NOW-   PAIN IS 3-4.   HAS VAG BLEEDING - STARTED LAST Thursday-   SAYS  BLEEDING IS LESS THAN LAST WEEK- MORE LIKE SPOTTING.

## 2013-03-09 NOTE — H&P (Signed)
Jody [name] is an 42 y.o.MBfemale who was sent from office for further eval w/ high suspicion for ectopic pregnancy.  Pt reports VB starting last Thurs, 03/01/13, w/ onset of intermittent LLQ pain the following day, 03/02/13, which has continued since, but worsening today.  Presented to office w/ 10/10 LLQ pain, but currently is w/o pain in MAU.  Reports pain has been worse overnight and AM, but tapering during daytime, and reports, almost didn't keep appointment today.  Of significance, pt had Mirena IUD placed in 2011.  Had been taking Midol intermittently w/ some relief, and last had a dose around noon-1300 today w/ water to drink.  Last solid food was breakfast.  Denies fever or recent illness.  No UTI s/s.  Regular BM.    She does not desire future pregnancies/children.  Pt is employed in [employer] in [employer].    Pertinent Gynecological History: Menses: irregular or absence of mense since Mirena and if present, very light spotting Bleeding: see above Contraception: IUD and Mirena since 2011 DES exposure: denies Previous GYN Procedures: LTCS w/ first child for breech presentation  OB History: G3P2002 (42y.o. & 42y.o--VBAC.)   Menstrual History:  Patient's last menstrual period was 03/01/2013.    Past Medical History  Diagnosis Date  . History of chicken pox   . Hx of mumps   . Low iron   . Yeast infection   . H/O amenorrhea   . Hypertension     Past Surgical History  Procedure Laterality Date  . Breast reduction surgery  2009    bilateral  . Cesarean section      Family History  Problem Relation Age of Onset  . Hypertension Mother   . Diabetes Mother   . Hypertension Maternal Grandmother     Social History:  reports that she has never smoked. She has never used smokeless tobacco. She reports that she does not drink alcohol or use illicit drugs.  Allergies: No Known Allergies  Prescriptions prior to admission  Medication Sig Dispense Refill  . Ibuprofen  (MIDOL) 200 MG CAPS Take 2 capsules by mouth daily as needed (For cramping.).      Marland Kitchen levonorgestrel (MIRENA) 20 MCG/24HR IUD 1 each by Intrauterine route once.      . phentermine 37.5 MG capsule Take 37.5 mg by mouth every morning.        Review of Systems  Constitutional: Negative.   HENT: Negative.   Eyes: Negative.   Respiratory: Negative.   Cardiovascular: Negative.   Gastrointestinal: Positive for abdominal pain.  Genitourinary: Negative.   Musculoskeletal: Negative.   Skin: Negative.   Neurological: Negative.     Blood pressure 135/83, pulse 80, temperature 98.8 F (37.1 C), temperature source Oral, resp. rate 18, height 5\' 9"  (1.753 m), weight 217 lb 6 oz (98.601 kg), last menstrual period 03/01/2013.  Physical Exam  Constitutional: She is oriented to person, place, and time. She appears well-developed and well-nourished. No distress.  Pleasant, no grimace or wincing, smiles and appropriate  HENT:  Head: Normocephalic and atraumatic.  Eyes: Pupils are equal, round, and reactive to light.  glasses  Cardiovascular: Normal rate.   Respiratory: Effort normal.  GI: Soft. There is tenderness. There is no guarding.  Genitourinary:  Pelvic exam done at office, so deferred in MAU; Gc/ct and wet prep sent from office  Musculoskeletal: She exhibits no edema.  Neurological: She is alert and oriented to person, place, and time. She has normal reflexes.  Skin: Skin is  warm and dry.  Psychiatric: She has a normal mood and affect. Her behavior is normal. Judgment and thought content normal.    Results for orders placed during the hospital encounter of 03/09/13 (from the past 24 hour(s))  ABO/RH     Status: None   Collection Time    03/09/13  6:35 PM      Result Value Range   ABO/RH(D) A POS    CBC WITH DIFFERENTIAL     Status: Abnormal   Collection Time    03/09/13  6:35 PM      Result Value Range   WBC 10.7 (*) 4.0 - 10.5 K/uL   RBC 4.55  3.87 - 5.11 MIL/uL   Hemoglobin  12.2  12.0 - 15.0 g/dL   HCT 62.1  30.8 - 65.7 %   MCV 81.5  78.0 - 100.0 fL   MCH 26.8  26.0 - 34.0 pg   MCHC 32.9  30.0 - 36.0 g/dL   RDW 84.6  96.2 - 95.2 %   Platelets 317  150 - 400 K/uL   Neutrophils Relative % 75  43 - 77 %   Neutro Abs 8.0 (*) 1.7 - 7.7 K/uL   Lymphocytes Relative 22  12 - 46 %   Lymphs Abs 2.3  0.7 - 4.0 K/uL   Monocytes Relative 3  3 - 12 %   Monocytes Absolute 0.3  0.1 - 1.0 K/uL   Eosinophils Relative 0  0 - 5 %   Eosinophils Absolute 0.0  0.0 - 0.7 K/uL   Basophils Relative 0  0 - 1 %   Basophils Absolute 0.0  0.0 - 0.1 K/uL  HCG, QUANTITATIVE, PREGNANCY     Status: Abnormal   Collection Time    03/09/13  6:35 PM      Result Value Range   hCG, Beta Chain, Quant, S 362 (*) <5 mIU/mL   *RADIOLOGY REPORT*  Clinical Data: Left lower quadrant abdominal pain, IUD in place,  positive urine pregnancy test in office, positive Beta HCG,  evaluate for ectopic pregnancy  OBSTETRIC <14 WK [place] AND TRANSVAGINAL OB US  Technique: Both transabdominal and transvaginal ultrasound  examinations were performed for complete evaluation of the  gestation as well as the maternal uterus, adnexal regions, and  pelvic cul-de-sac. Transvaginal technique was performed to assess  early pregnancy.  Comparison: None.  Intrauterine gestational sac: None-visualized  Maternal uterus/adnexae:  An intrauterine device appears appropriately located within the  anteverted uterus. No discrete uterine mass. Normal appearance of  the cervix.  The left ovary/adnexa is enlarged measuring approximately 6.0 x 4.2  x 5.1 cm. A possible gestational sac is noted within the left  adnexa (images 35-37). This finding is associated with mixed  echogenic material within the adjacent left ovary/adnexa worrisome  for hemorrhage/hematoma. There is a trace amount of fluid in the  pelvic cul-de-sac.  Normal appearance of the right ovary, measuring approximately 4.1 x  2.0 x 1.8 cm. A small peripheral  follicles noted within the right  ovary. No discrete right adnexal mass.  IMPRESSION:  1. Findings concerning for ectopic pregnancy within the left  adnexa.  2. IUD device appropriately located within an otherwise normal-  appearing uterus.  3. Small amount of free fluid within the pelvic cul-de-sac.  Above findings discussed with Cammie Mcgee, midwife at [phone].  Original Report Authenticated By: Tacey Ruiz, MD   Assessment/Plan: 1. 41y.o. MBF 2. Lt Ectopic pregnancy 3. IUD in place  1. Admit to  [facility] w/ Dr. Pennie Rushing as attending 2. Pt given options of Laparoscopic removal of ectopic, or Methotrexate.  R/b/a rev'd w/ pt for both options, and after discussion, pt desires to proceed w/ surgery, stating, "I want to be done with this." Pt verbalized understanding Lt tube may need to be removed during surgery. 3. MD to follow 4. IV now, remain NPO, and prep for OR  Dulcemaria Bula H 03/09/2013, 8:17 PM

## 2013-03-09 NOTE — MAU Note (Signed)
Pt states was seen at MD office today for llq pain, told she was pregnant, has IUD still in place. Sent here for lab work, and ultrasound.

## 2013-03-09 NOTE — H&P (Signed)
History and Physical Interval Note:   03/09/2013   9:27 PM   Jody [name]  has presented today for surgery, with the diagnosis of Left Ectopic Pregnancy  The various methods of treatment have been discussed with the patient and family. After consideration of risks, benefits and other options for treatment, the patient has consented to  Procedure(s): LAPAROSCOPY OPERATIVE as a surgical intervention .  I have reviewed the patients' chart and labs.  Questions were answered to the patient's satisfaction.  I discussed at length. The patient's future fertility plans. She plans no further children. She requests surgical sterilization. In addition to management of her left ectopic pregnancy. I am this planning to do a bilateral salpingectomy, followed by removal of her Mirena IUD per her request. She is in agreement and wishes to proceed   Hal Morales  MD

## 2013-03-09 NOTE — Anesthesia Preprocedure Evaluation (Signed)
Anesthesia Evaluation  Patient identified by MRN, date of birth, ID band Patient awake    Reviewed: Allergy & Precautions, H&P , NPO status , Patient's Chart, lab work & pertinent test results  Airway Mallampati: II TM Distance: >3 FB Neck ROM: full    Dental no notable dental hx. (+) Teeth Intact   Pulmonary neg pulmonary ROS,    Pulmonary exam normal       Cardiovascular     Neuro/Psych negative neurological ROS  negative psych ROS   GI/Hepatic negative GI ROS, Neg liver ROS,   Endo/Other  negative endocrine ROS  Renal/GU negative Renal ROS  negative genitourinary   Musculoskeletal negative musculoskeletal ROS (+)   Abdominal Normal abdominal exam  (+)   Peds negative pediatric ROS (+)  Hematology negative hematology ROS (+)   Anesthesia Other Findings   Reproductive/Obstetrics negative OB ROS                           Anesthesia Physical Anesthesia Plan  ASA: II and emergent  Anesthesia Plan: General   Post-op Pain Management:    Induction: Intravenous  Airway Management Planned: Oral ETT  Additional Equipment:   Intra-op Plan:   Post-operative Plan: Extubation in OR  Informed Consent: I have reviewed the patients History and Physical, chart, labs and discussed the procedure including the risks, benefits and alternatives for the proposed anesthesia with the patient or authorized representative who has indicated his/her understanding and acceptance.   Dental Advisory Given  Plan Discussed with: CRNA and Surgeon  Anesthesia Plan Comments:         Anesthesia Quick Evaluation

## 2013-03-10 DIAGNOSIS — O00109 Unspecified tubal pregnancy without intrauterine pregnancy: Secondary | ICD-10-CM | POA: Diagnosis present

## 2013-03-10 DIAGNOSIS — K661 Hemoperitoneum: Secondary | ICD-10-CM | POA: Diagnosis present

## 2013-03-10 MED ORDER — HYDROCODONE-ACETAMINOPHEN 5-300 MG PO TABS: ORAL_TABLET | ORAL | Status: DC

## 2013-03-10 MED ORDER — HYDROCODONE-ACETAMINOPHEN 5-325 MG PO TABS
1.0000 | ORAL_TABLET | Freq: Once | ORAL | Status: AC
  Administered 2013-03-10: 1 via ORAL

## 2013-03-10 MED ORDER — MEPERIDINE HCL 25 MG/ML IJ SOLN: 6.2500 mg | INTRAMUSCULAR | Status: DC | PRN

## 2013-03-10 MED ORDER — NEOSTIGMINE METHYLSULFATE 1 MG/ML IJ SOLN
INTRAMUSCULAR | Status: DC | PRN
  Administered 2013-03-10: 4 mg via INTRAVENOUS

## 2013-03-10 MED ORDER — IBUPROFEN 600 MG PO TABS: ORAL_TABLET | ORAL | Status: DC

## 2013-03-10 MED ORDER — DEXTROSE IN LACTATED RINGERS 5 % IV SOLN
Freq: Once | INTRAVENOUS | Status: AC
  Administered 2013-03-10: 02:00:00 via INTRAVENOUS

## 2013-03-10 MED ORDER — HYDROMORPHONE HCL PF 1 MG/ML IJ SOLN
0.2500 mg | INTRAMUSCULAR | Status: DC | PRN
  Administered 2013-03-10: 0.5 mg via INTRAVENOUS

## 2013-03-10 MED ORDER — KETOROLAC TROMETHAMINE 30 MG/ML IJ SOLN: 15.0000 mg | Freq: Once | INTRAMUSCULAR | Status: DC | PRN

## 2013-03-10 MED ORDER — GLYCOPYRROLATE 0.2 MG/ML IJ SOLN
INTRAMUSCULAR | Status: DC | PRN
  Administered 2013-03-10: .8 mg via INTRAVENOUS

## 2013-03-10 MED ORDER — PROMETHAZINE HCL 25 MG/ML IJ SOLN: 6.2500 mg | INTRAMUSCULAR | Status: DC | PRN

## 2013-03-10 MED FILL — Heparin Sodium (Porcine) Inj 5000 Unit/ML: INTRAMUSCULAR | Qty: 1 | Status: AC

## 2013-03-10 NOTE — Anesthesia Postprocedure Evaluation (Signed)
Anesthesia Post Note  Patient: Jody [name]  Procedure(s) Performed: Procedure(s) (LRB): LAPAROSCOPY OPERATIVE, Bilateral Salpingectomy with Ectopic Pregnancy Left (Left)  Anesthesia type: General  Patient location: PACU  Post pain: Pain level controlled  Post assessment: Post-op Vital signs reviewed  Last Vitals:  Filed Vitals:   03/10/13 0024  BP:   Pulse: 70  Temp: 36.7 C  Resp:     Post vital signs: Reviewed  Level of consciousness: sedated  Complications: No apparent anesthesia complications

## 2013-03-10 NOTE — Transfer of Care (Signed)
Immediate Anesthesia Transfer of Care Note  Patient: Jody [name]  Procedure(s) Performed: Procedure(s): LAPAROSCOPY OPERATIVE, Bilateral Salpingectomy with Ectopic Pregnancy Left (Left)  Patient Location: PACU  Anesthesia Type:General  Level of Consciousness: awake, alert , oriented and sedated  Airway & Oxygen Therapy: Patient Spontanous Breathing and Patient connected to nasal cannula oxygen  Post-op Assessment: Report given to PACU RN and Post -op Vital signs reviewed and stable  Post vital signs: Reviewed and stable  Complications: No apparent anesthesia complications

## 2013-03-10 NOTE — Op Note (Signed)
Operative Laparoscopy Procedure Note  Procedure:  Operative laparoscopy. The left salpingectomy and removal of ectopic pregnancy. Right salpingectomy  Indications: The patient is a 42 y.o. female with ruptured left tubal ectopic pregnancy with Mirena IUD in, place. We'll also desires surgical sterilization.  Pre-operative Diagnosis: Left tubal ectopic pregnancy     Mirena IUD in place     Desire for surgical sterilization  Post-operative Diagnosis: Left tubal ectopic pregnancy, ruptured     Hemoperitoneum     Mirena IUD in place     Desire for surgical sterilization  Surgeon: Dierdre Forth P   Assistants: None  Anesthesia: General endotracheal anesthesia  ASA Class: 2  Procedure Details  The patient was seen in the Holding Room. The risks, benefits, complications, treatment options, and expected outcomes were discussed with the patient. The possibilities of reaction to medication, pulmonary aspiration, perforation of viscus, bleeding, recurrent infection, the need for additional procedures, failure to diagnose a condition, and creating a complication requiring transfusion or operation were discussed with the patient. The patient concurred with the proposed plan, giving informed consent. The patient was taken to the Operating Room, identified as Jody [name] and the procedure verified as Diagnostic Laparoscopy. A Time Out was held and the above information confirmed.  After induction of general anesthesia, the patient was placed in modified dorsal lithotomy position where she was prepped, draped, and catheterized in the normal, sterile fashion.  The cervix was visualized, the Mirena IUD easily removed, and an intrauterine manipulator was placed. Subumbilical and suprapubic injection of quarter percent Marcaine was undertaken. A 2 cm umbilical incision was then performed. A combination of blunt and sharp dissection was performed to identify the fascia, which was grasped with Coker  clamps and incised. The peritoneum was entered bluntly and marked on either side along with the fascia with sutures of 0 Vicryl. The Hassan cannula was placed into the peritoneal cavity and a pneumoperitoneum created with 3 L of CO2. The laparoscope was placed through the trocar sleeve. Suprapubic incisions to the right and left of midline. Were made. A 10 mm trocar was placed through the left suprapubic incision under direct visualization into the peritoneal cavity, and a 5 mm trocar placed into the right suprapubic incision under direct visualization. The above findings were noted.  The left fallopian tube which contained the ectopic pregnancy with elevated and the mesial salpinx cauterized and cut along the length of the 2 up to the uterotubal junction. This was cauterized and cut and the ectopic-containing left fallopian tube placed in the anterior cul-de-sac. The right fallopian tube was identified and elevated. It was cauterized and cut along the mesial salpinx from the fimbriated end to the uterotubal junction. It was then excised sharply and placed in the anterior cul-de-sac. An Endobag retrieval set was placed through the suprapubic 10 mm port and the fallopian tubes placed in the Endo Catch bag. It was removed through the left suprapubic incision. The peritoneal cavity was copiously irrigated with saline and hemostasis noted to be adequate.  Following the procedure the all the instruments were removed from the peritoneal cavity. Under direct visualization as the CO2 was allowed to escape. The subumbilical incision was closed with the holding 0 Vicryl fascial sutures in a figure-of-eight fashion. The skin incision was closed with a subcuticular suture of 4-0 Monocryl. The left suprapubic 10 mm incision was closed with a fascial stitch of 0 Vicryl in a figure-of-eight fashion and both suprapubic skin incisions were reapproximated with Dermabond. The intrauterine  manipulator was then  removed.  Instrument, sponge, and needle counts were correct prior to abdominal closure and at the conclusion of the case.   Findings: The anterior cul-de-sac and round ligaments : Hemoperitoneum of approximately 100 cc was divided between the anterior and posterior cul-de-sacs. The uterus appeared normal. The adnexa: The left fallopian tube was markedly enlarged to approximately 5 times its normal. Size with an appearance ectopic pregnancy and clot. The left ovary was within normal limits. The right tube and ovary were within normal limits.   Estimated Blood Loss:  less than 100 mL         Drains: None         Total IV Fluids: 2000 mL         Specimens: Left fallopian tube with ectopic.   Right fallopian tube           Complications:  None; patient tolerated the procedure well.         Disposition: PACU - hemodynamically stable.         Condition: stable

## 2013-03-12 ENCOUNTER — Encounter (HOSPITAL_COMMUNITY): Payer: Self-pay | Admitting: Obstetrics and Gynecology

## 2013-09-03 ENCOUNTER — Other Ambulatory Visit: Payer: Self-pay

## 2013-09-03 DIAGNOSIS — Z1231 Encounter for screening mammogram for malignant neoplasm of breast: Secondary | ICD-10-CM

## 2013-09-07 ENCOUNTER — Ambulatory Visit
Admission: RE | Admit: 2013-09-07 | Discharge: 2013-09-07 | Disposition: A | Discharge status: Home / Self Care | Payer: Managed Care, Other (non HMO) | Source: Ambulatory Visit

## 2013-09-07 DIAGNOSIS — Z1231 Encounter for screening mammogram for malignant neoplasm of breast: Secondary | ICD-10-CM

## 2013-10-05 ENCOUNTER — Encounter (HOSPITAL_COMMUNITY): Payer: Self-pay | Admitting: Emergency Medicine

## 2013-10-05 ENCOUNTER — Emergency Department (INDEPENDENT_AMBULATORY_CARE_PROVIDER_SITE_OTHER)
Admission: EM | Admit: 2013-10-05 | Discharge: 2013-10-05 | Disposition: A | Discharge status: Home / Self Care | Payer: Managed Care, Other (non HMO) | Source: Home / Self Care

## 2013-10-05 DIAGNOSIS — N390 Urinary tract infection, site not specified: Secondary | ICD-10-CM

## 2013-10-05 LAB — POCT URINALYSIS DIP (DEVICE)
BILIRUBIN URINE: NEGATIVE
GLUCOSE, UA: NEGATIVE mg/dL
Ketones, ur: NEGATIVE mg/dL
Nitrite: NEGATIVE
PH: 7 (ref 5.0–8.0)
Protein, ur: NEGATIVE mg/dL
SPECIFIC GRAVITY, URINE: 1.01 (ref 1.005–1.030)
Urobilinogen, UA: 0.2 mg/dL (ref 0.0–1.0)

## 2013-10-05 MED ORDER — CEPHALEXIN 500 MG PO CAPS: 500.0000 mg | ORAL_CAPSULE | Freq: Four times a day (QID) | ORAL | Status: DC

## 2013-10-05 NOTE — ED Provider Notes (Signed)
Medical screening examination/treatment/procedure(s) were performed by resident physician or non-physician practitioner and as supervising physician I was immediately available for consultation/collaboration.   Reise Hietala DOUGLAS MD.   Tomasa Dobransky D Linzy Darling, MD 10/05/13 1306 

## 2013-10-05 NOTE — ED Provider Notes (Signed)
CSN: [id number]     Arrival date & time 10/05/13  0831 History   First MD Initiated Contact with Patient 10/05/13 [phone]     Chief Complaint  Patient presents with  . Urinary Tract Infection   (Consider location/radiation/quality/duration/timing/severity/associated sxs/prior Treatment) HPI Comments: 43 year old female complains of dysuria, urinary frequency and urgency. Denies vaginal symptoms or fever. LMP January it was 6, 2015   Past Medical History  Diagnosis Date  . History of chicken pox   . Hx of mumps   . Low iron   . Yeast infection   . H/O amenorrhea   . Hypertension    Past Surgical History  Procedure Laterality Date  . Breast reduction surgery  2009    bilateral  . Cesarean section    . Laparoscopy Left 03/09/2013    Procedure: LAPAROSCOPY OPERATIVE, Bilateral Salpingectomy with Ectopic Pregnancy Left;  Surgeon: Hal MoralesVanessa P Haygood, MD;  Location: [facility];  Service: Gynecology;  Laterality: Left;  . Salpingectomy     Family History  Problem Relation Age of Onset  . Hypertension Mother   . Diabetes Mother   . Hypertension Maternal Grandmother    History  Substance Use Topics  . Smoking status: Never Smoker   . Smokeless tobacco: Never Used  . Alcohol Use: No   OB History   Grav Para Term Preterm Abortions TAB SAB Ect Mult Living   3 2 1  1   1  2      Review of Systems  Constitutional: Negative.   Respiratory: Negative.   Gastrointestinal: Negative.   Genitourinary: Positive for dysuria, urgency and frequency. Negative for flank pain, vaginal bleeding, vaginal discharge, difficulty urinating, menstrual problem and pelvic pain.    Allergies  Review of patient's allergies indicates no known allergies.  Home Medications   Current Outpatient Rx  Name  Route  Sig  Dispense  Refill  . cephALEXin (KEFLEX) 500 MG capsule   Oral   Take 1 capsule (500 mg total) by mouth 4 (four) times daily.   28 capsule   0   . Hydrocodone-Acetaminophen (VICODIN) 5-300 MG  TABS      1 or 2 tablets every 4 hours as needed. For severe pain   30 each   0   . ibuprofen (ADVIL,MOTRIN) 600 MG tablet      Ibuprofen 600 mg every 6 hours for 3 days and then every 6 hours as needed. For pain   60 tablet   0   . phentermine 37.5 MG capsule   Oral   Take 37.5 mg by mouth every morning.          BP 144/97  Pulse 80  Temp(Src) 98.5 F (36.9 C) (Oral)  Resp 16  SpO2 100% Physical Exam  Nursing note and vitals reviewed. Constitutional: She is oriented to person, place, and time. She appears well-developed and well-nourished. No distress.  Eyes: Conjunctivae and EOM are normal.  Neck: Normal range of motion. Neck supple.  Pulmonary/Chest: Effort normal. No respiratory distress.  Neurological: She is alert and oriented to person, place, and time. She exhibits normal muscle tone.  Skin: Skin is warm and dry.  Psychiatric: She has a normal mood and affect.    ED Course  Procedures (including critical care time) Labs Review Labs Reviewed  POCT URINALYSIS DIP (DEVICE) - Abnormal; Notable for the following:    Hgb urine dipstick MODERATE (*)    Leukocytes, UA LARGE (*)    All other components within normal limits  URINE CULTURE   Imaging Review No results found. Results for orders placed during the hospital encounter of 10/05/13  POCT URINALYSIS DIP (DEVICE)      Result Value Range   Glucose, UA NEGATIVE  NEGATIVE mg/dL   Bilirubin Urine NEGATIVE  NEGATIVE   Ketones, ur NEGATIVE  NEGATIVE mg/dL   Specific Gravity, Urine 1.010  1.005 - 1.030   Hgb urine dipstick MODERATE (*) NEGATIVE   pH 7.0  5.0 - 8.0   Protein, ur NEGATIVE  NEGATIVE mg/dL   Urobilinogen, UA 0.2  0.0 - 1.0 mg/dL   Nitrite NEGATIVE  NEGATIVE   Leukocytes, UA LARGE (*) NEGATIVE      MDM   1. UTI (lower urinary tract infection)      Keflex x 7 d Plenty of fluids AZO forr sx's. Urine culture pending  Hayden Rasmussen, NP 10/05/13 [phone]

## 2013-10-05 NOTE — Discharge Instructions (Signed)
Antibiotic Medication Antibiotics are among the most frequently prescribed medicines. Antibiotics cure illness by assisting our body to injure or kill the bacteria that cause infection. While antibiotics are useful to treat a wide variety of infections they are useless against viruses. Antibiotics cannot cure colds, flu, or other viral infections.  There are many types of antibiotics available. Your caregiver will decide which antibiotic will be useful for an illness. Never take or give someone else's antibiotics or left over medicine. Your caregiver may also take into account:  Allergies.  The cost of the medicine.  Dosing schedules.  Taste.  Common side effects when choosing an antibiotic for an infection. Ask your caregiver if you have questions about why a certain medicine was chosen. HOME CARE INSTRUCTIONS Read all instructions and labels on medicine bottles carefully. Some antibiotics should be taken on an empty stomach while others should be taken with food. Taking antibiotics incorrectly may reduce how well they work. Some antibiotics need to be kept in the refrigerator. Others should be kept at room temperature. Ask your caregiver or pharmacist if you do not understand how to give the medicine. Be sure to give the amount of medicine your caregiver has prescribed. Even if you feel better and your symptoms improve, bacteria may still remain alive in the body. Taking all of the medicine will prevent:  The infection from returning and becoming harder to treat.  Complications from partially treated infections. If there is any medicine left over after you have taken the medicine as your caregiver has instructed, throw the medicine away. Be sure to tell your caregiver if you:  Are allergic to any medicines.  Are pregnant or intend to become pregnant while using this medicine.  Are breastfeeding.  Are taking any other prescription, non-prescription medicine, or herbal  remedies.  Have any other medical conditions or problems you have not already discussed. If you are taking birth control pills, they may not work while you are on antibiotics. To avoid unwanted pregnancy:  Continue taking your birth control pills as usual.  Use a second form of birth control (such as condoms) while you are taking antibiotic medicine.  When you finish taking the antibiotic medicine, continue using the second form of birth control until you are finished with your current 1 month cycle of birth control pills. Try not to miss any doses of medicine. If you miss a dose, take it as soon as possible. However, if it is almost time for the next dose and the dosing schedule is:  2 doses a day, take the missed dose and the next dose 5 to 6 hours apart.  3 or more doses a day, take the missed dose and the next dose 2 to 4 hours apart, then go back to the normal schedule.  If you are unable to make up a missed dose, take the next scheduled dose on time and complete the missed dose at the end of the prescribed time for your medicine. SIDE EFFECTS TO TAKING ANTIBIOTICS Common side effects to antibiotic use include:  Soft stools or diarrhea.  Mild stomach upset.  Sun sensitivity. SEEK MEDICAL CARE IF:   If you get worse or do not improve within a few days of starting the medicine.  Vomiting develops.  Diaper rash or rash on the genitals appears.  Vaginal itching occurs.  White patches appear on the tongue or in the mouth.  Severe watery diarrhea and abdominal cramps occur.  Signs of an allergy develop (hives, unknown  itchy rash appears). STOP TAKING THE ANTIBIOTIC. SEEK IMMEDIATE MEDICAL CARE IF:   Urine turns dark or blood colored.  Skin turns yellow.  Easy bruising or bleeding occurs.  Joint pain or muscle aches occur.  Fever returns.  Severe headache occurs.  Signs of an allergy develop (trouble breathing, wheezing, swelling of the lips, face or tongue,  fainting, or blisters on the skin or in the mouth). STOP TAKING THE ANTIBIOTIC. Document Released: 05/12/2004 Document Revised: 11/22/2011 Document Reviewed: 05/22/2009 [facility] LPExitCare Patient Information 2014 [place], [place].  Urinary Tract Infection AZO Standard or similar medicine for symptom relief Urinary tract infections (UTIs) can develop anywhere along your urinary tract. Your urinary tract is your body's drainage system for removing wastes and extra water. Your urinary tract includes two kidneys, two ureters, a bladder, and a urethra. Your kidneys are a pair of bean-shaped organs. Each kidney is about the size of your fist. They are located below your ribs, one on each side of your spine. CAUSES Infections are caused by microbes, which are microscopic organisms, including fungi, viruses, and bacteria. These organisms are so small that they can only be seen through a microscope. Bacteria are the microbes that most commonly cause UTIs. SYMPTOMS  Symptoms of UTIs may vary by age and gender of the patient and by the location of the infection. Symptoms in young women typically include a frequent and intense urge to urinate and a painful, burning feeling in the bladder or urethra during urination. Older women and men are more likely to be tired, shaky, and weak and have muscle aches and abdominal pain. A fever may mean the infection is in your kidneys. Other symptoms of a kidney infection include pain in your back or sides below the ribs, nausea, and vomiting. DIAGNOSIS To diagnose a UTI, your caregiver will ask you about your symptoms. Your caregiver also will ask to provide a urine sample. The urine sample will be tested for bacteria and white blood cells. White blood cells are made by your body to help fight infection. TREATMENT  Typically, UTIs can be treated with medication. Because most UTIs are caused by a bacterial infection, they usually can be treated with the use of antibiotics. The choice of  antibiotic and length of treatment depend on your symptoms and the type of bacteria causing your infection. HOME CARE INSTRUCTIONS  If you were prescribed antibiotics, take them exactly as your caregiver instructs you. Finish the medication even if you feel better after you have only taken some of the medication.  Drink enough water and fluids to keep your urine clear or pale yellow.  Avoid caffeine, tea, and carbonated beverages. They tend to irritate your bladder.  Empty your bladder often. Avoid holding urine for long periods of time.  Empty your bladder before and after sexual intercourse.  After a bowel movement, women should cleanse from front to back. Use each tissue only once. SEEK MEDICAL CARE IF:   You have back pain.  You develop a fever.  Your symptoms do not begin to resolve within 3 days. SEEK IMMEDIATE MEDICAL CARE IF:   You have severe back pain or lower abdominal pain.  You develop chills.  You have nausea or vomiting.  You have continued burning or discomfort with urination. MAKE SURE YOU:   Understand these instructions.  Will watch your condition.  Will get help right away if you are not doing well or get worse. Document Released: 06/09/2005 Document Revised: 02/29/2012 Document Reviewed: 10/08/2011 [employer] Patient Information  2014 [employer]. ° °

## 2013-10-05 NOTE — ED Notes (Signed)
Reported 24-36 hour duration of pain w urination, worse at end of UA stream. Feels like previous UTI in past

## 2013-10-17 ENCOUNTER — Encounter (HOSPITAL_COMMUNITY): Payer: Self-pay | Admitting: Emergency Medicine

## 2013-10-17 ENCOUNTER — Emergency Department (INDEPENDENT_AMBULATORY_CARE_PROVIDER_SITE_OTHER)
Admission: EM | Admit: 2013-10-17 | Discharge: 2013-10-17 | Disposition: A | Discharge status: Home / Self Care | Payer: Managed Care, Other (non HMO) | Source: Home / Self Care | Attending: Emergency Medicine | Admitting: Emergency Medicine

## 2013-10-17 DIAGNOSIS — N3000 Acute cystitis without hematuria: Secondary | ICD-10-CM

## 2013-10-17 LAB — POCT URINALYSIS DIP (DEVICE)
BILIRUBIN URINE: NEGATIVE
Glucose, UA: 100 mg/dL — AB
KETONES UR: NEGATIVE mg/dL
Nitrite: POSITIVE — AB
PH: 7 (ref 5.0–8.0)
PROTEIN: 30 mg/dL — AB
SPECIFIC GRAVITY, URINE: 1.01 (ref 1.005–1.030)
Urobilinogen, UA: 1 mg/dL (ref 0.0–1.0)

## 2013-10-17 LAB — POCT PREGNANCY, URINE: Preg Test, Ur: NEGATIVE

## 2013-10-17 MED ORDER — MOXIFLOXACIN HCL 400 MG PO TABS: 400.0000 mg | ORAL_TABLET | Freq: Every day | ORAL | Status: DC

## 2013-10-17 NOTE — ED Notes (Signed)
Pt  Has  Symptoms  Of  Urinary  Frequency            Recently  Seen for  uti  But  Did  Not  Comply  Completely  With     Plan of  Care

## 2013-10-17 NOTE — Discharge Instructions (Signed)
Urinary Tract Infection  Urinary tract infections (UTIs) can develop anywhere along your urinary tract. Your urinary tract is your body's drainage system for removing wastes and extra water. Your urinary tract includes two kidneys, two ureters, a bladder, and a urethra. Your kidneys are a pair of bean-shaped organs. Each kidney is about the size of your fist. They are located below your ribs, one on each side of your spine.  CAUSES  Infections are caused by microbes, which are microscopic organisms, including fungi, viruses, and bacteria. These organisms are so small that they can only be seen through a microscope. Bacteria are the microbes that most commonly cause UTIs.  SYMPTOMS   Symptoms of UTIs may vary by age and gender of the patient and by the location of the infection. Symptoms in young women typically include a frequent and intense urge to urinate and a painful, burning feeling in the bladder or urethra during urination. Older women and men are more likely to be tired, shaky, and weak and have muscle aches and abdominal pain. A fever may mean the infection is in your kidneys. Other symptoms of a kidney infection include pain in your back or sides below the ribs, nausea, and vomiting.  DIAGNOSIS  To diagnose a UTI, your caregiver will ask you about your symptoms. Your caregiver also will ask to provide a urine sample. The urine sample will be tested for bacteria and white blood cells. White blood cells are made by your body to help fight infection.  TREATMENT   Typically, UTIs can be treated with medication. Because most UTIs are caused by a bacterial infection, they usually can be treated with the use of antibiotics. The choice of antibiotic and length of treatment depend on your symptoms and the type of bacteria causing your infection.  HOME CARE INSTRUCTIONS   If you were prescribed antibiotics, take them exactly as your caregiver instructs you. Finish the medication even if you feel better after you  have only taken some of the medication.   Drink enough water and fluids to keep your urine clear or pale yellow.   Avoid caffeine, tea, and carbonated beverages. They tend to irritate your bladder.   Empty your bladder often. Avoid holding urine for long periods of time.   Empty your bladder before and after sexual intercourse.   After a bowel movement, women should cleanse from front to back. Use each tissue only once.  SEEK MEDICAL CARE IF:    You have back pain.   You develop a fever.   Your symptoms do not begin to resolve within 3 days.  SEEK IMMEDIATE MEDICAL CARE IF:    You have severe back pain or lower abdominal pain.   You develop chills.   You have nausea or vomiting.   You have continued burning or discomfort with urination.  MAKE SURE YOU:    Understand these instructions.   Will watch your condition.   Will get help right away if you are not doing well or get worse.  Document Released: 06/09/2005 Document Revised: 02/29/2012 Document Reviewed: 10/08/2011  Exit[employer] Patient Information 2014 [employer].

## 2013-10-17 NOTE — ED Provider Notes (Signed)
Chief Complaint   Chief Complaint  Patient presents with  . Urinary Frequency    History of Present Illness   Jody [name] is a 42 year old female who was here 2 weeks ago with urinary tract symptoms. Her UA showed WBCs and RBCs. Culture was ordered but was not sent. She was prescribed cephalexin. She states she did not take his exactly as prescribed, missing several doses but she did finish up the entire prescription today. Today she's had a recurrence of the same symptoms with urinary frequency and urgency. She denies any dysuria, hematuria, fever, chills, nausea, vomiting, abdominal pain, or lower back pain.  Review of Systems   Other than as noted above, the patient denies any of the following symptoms: General:  No fevers, chills, or sweats. GI:  No abdominal pain, back pain, nausea, vomiting, diarrhea, or constipation. GU:  No dysuria, frequency, urgency, hematuria, or incontinence. GYN:  No discharge, itching, vulvar pain or lesions, pelvic pain, or abnormal vaginal bleeding.  PMFSH   Past medical history, family history, social history, meds, and allergies were reviewed.    Physical Examination     Vital signs:  LMP 10/14/2013 Gen:  Alert, oriented, in no distress. Lungs:  Clear to auscultation, no wheezes, rales or rhonchi. Heart:  Regular rhythm, no gallop or murmer. Abdomen:  Flat and soft. There was slight suprapubic pain to palpation.  No guarding, or rebound.  No hepato-splenomegaly or mass.  Bowel sounds were normally active.  No hernia. Back:  No CVA tenderness.  Skin:  Clear, warm and dry.  Labs   Results for orders placed during the hospital encounter of 10/17/13  POCT URINALYSIS DIP (DEVICE)      Result Value Range   Glucose, UA 100 (*) NEGATIVE mg/dL   Bilirubin Urine NEGATIVE  NEGATIVE   Ketones, ur NEGATIVE  NEGATIVE mg/dL   Specific Gravity, Urine 1.010  1.005 - 1.030   Hgb urine dipstick MODERATE (*) NEGATIVE   pH 7.0  5.0 - 8.0   Protein,  ur 30 (*) NEGATIVE mg/dL   Urobilinogen, UA 1.0  0.0 - 1.0 mg/dL   Nitrite POSITIVE (*) NEGATIVE   Leukocytes, UA LARGE (*) NEGATIVE  POCT PREGNANCY, URINE      Result Value Range   Preg Test, Ur NEGATIVE  NEGATIVE     A urine culture was obtained.  Results are pending at this time and we will call about any positive results.  Assessment   The encounter diagnosis was Acute cystitis.   No evidence of pyelonephritis.    Plan   1.  Meds:  The following meds were prescribed:   Discharge Medication List as of 10/17/2013 10:05 AM    START taking these medications   Details  moxifloxacin (AVELOX) 400 MG tablet Take 1 tablet (400 mg total) by mouth daily at 8 pm., Starting 10/17/2013, Until Discontinued, Normal        2.  Patient Education/Counseling:  The patient was given appropriate handouts, self care instructions, and instructed in symptomatic relief. The patient was told to avoid intercourse for 10 days, get extra fluids, and return for a follow up with her primary care doctor at the completion of treatment for a repeat UA and culture.    3.  Follow up:  The patient was told to follow up here if no better in 3 to 4 days, or sooner if becoming worse in any way, and given some red flag symptoms such as fever, persistent vomiting, or severe  flank or abdominal pain which would prompt immediate return.     Reuben Likesavid C Karas Pickerill, MD 10/17/13 [phone]

## 2013-10-18 LAB — URINE CULTURE: Colony Count: 8000

## 2013-10-18 NOTE — Progress Notes (Signed)
Quick Note:  Test result was normal. No further action is needed at this time. ______ 

## 2014-03-18 IMAGING — US US OB COMP LESS 14 WK
1 series · 13 of 28 positions shown · non-contrast
Comparison: None.

CLINICAL DATA: Left lower quadrant abdominal pain, IUD in place,
positive urine pregnancy test in office, positive Beta HCG,
evaluate for ectopic pregnancy

OBSTETRIC <14 WK US AND TRANSVAGINAL OB US
TECHNIQUE: Both transabdominal and transvaginal ultrasound
examinations were performed for complete evaluation of the
gestation as well as the maternal uterus, adnexal regions, and
pelvic cul-de-sac.  Transvaginal technique was performed to assess
early pregnancy.

[Series 1: us ob comp less 14 wks · 13 of 42 slices shown]
[im 2/42]
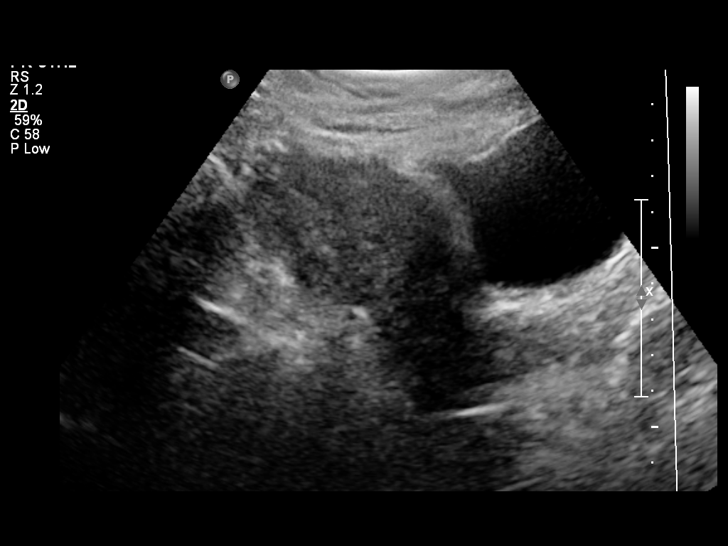
[im 5/42]
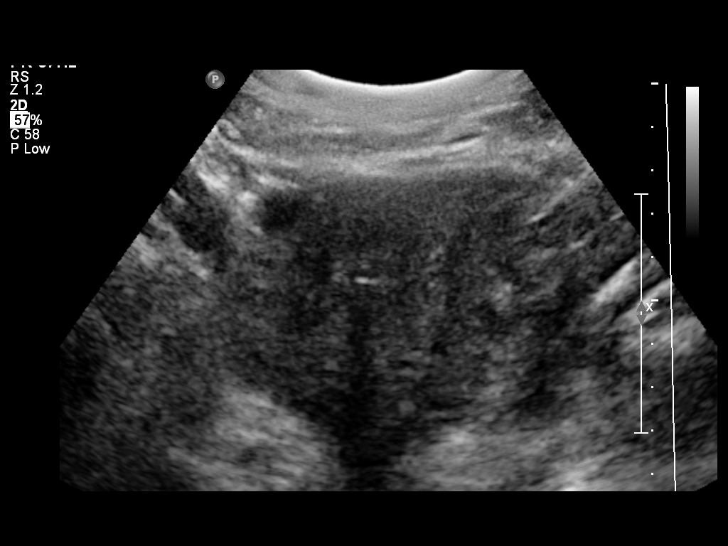
[im 8/42]
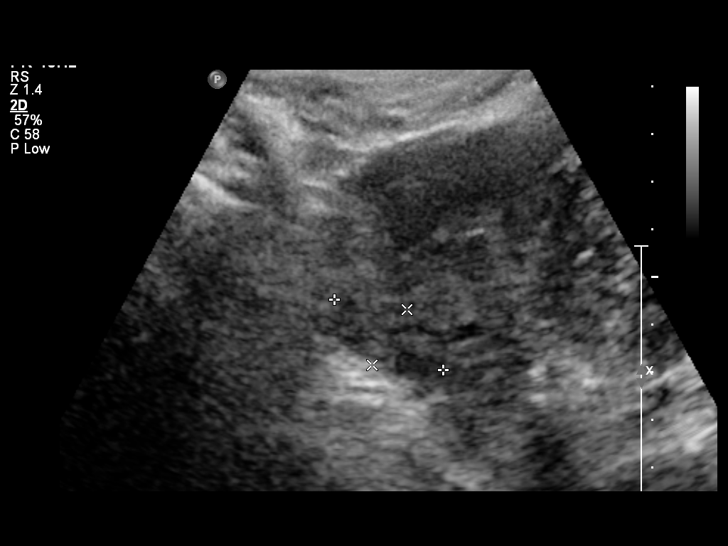
[im 11/42]
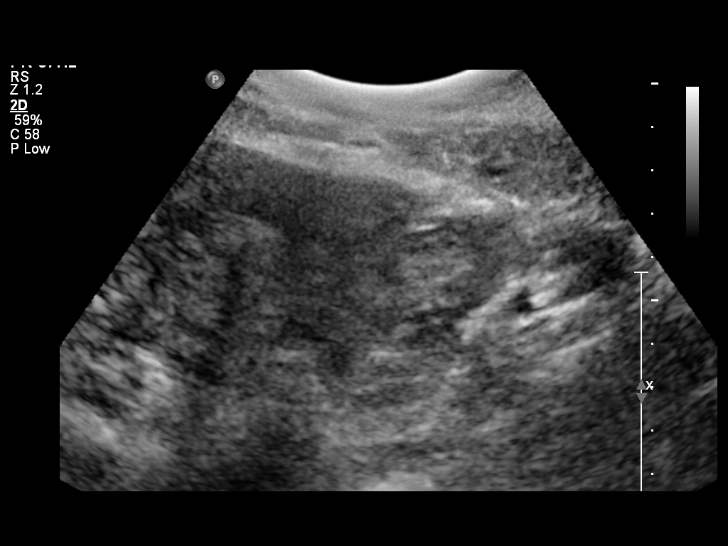
[im 14/42]
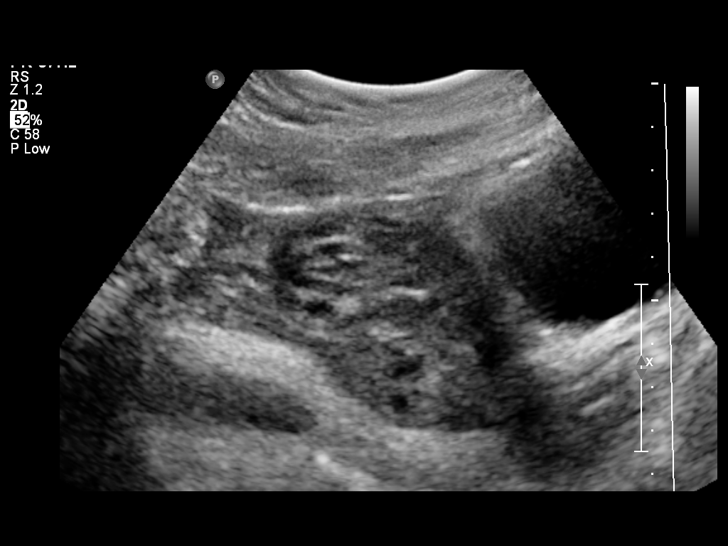
[im 17/42]
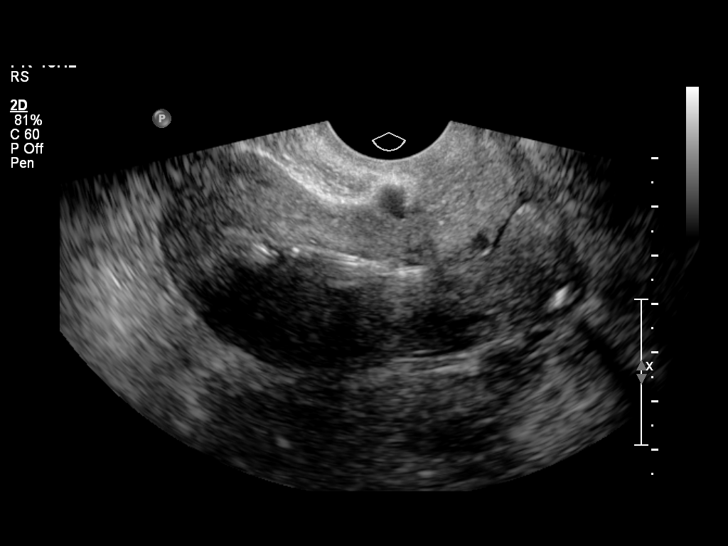
[im 22/42]
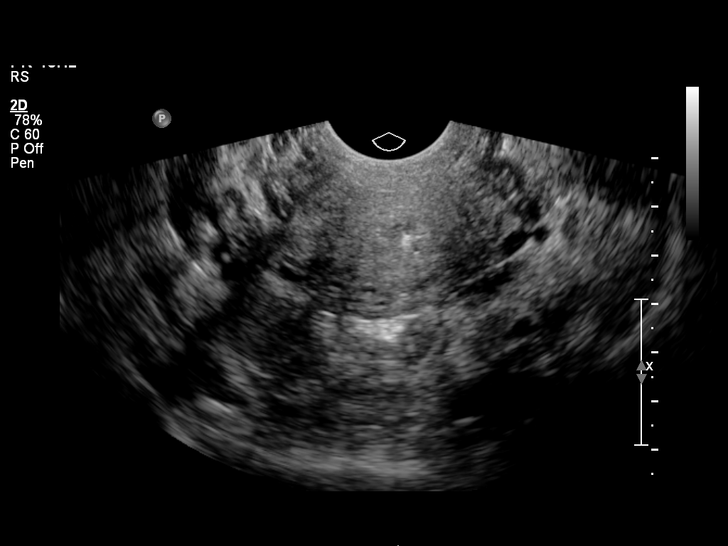
[im 25/42]
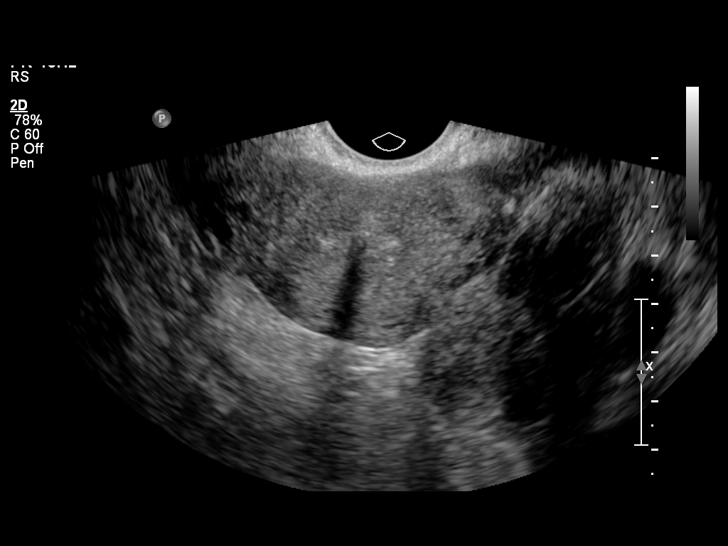
[im 28/42]
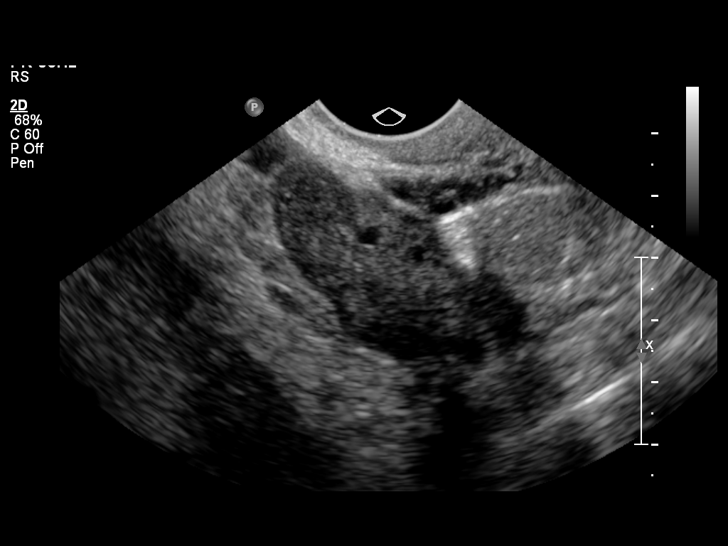
[im 31/42]
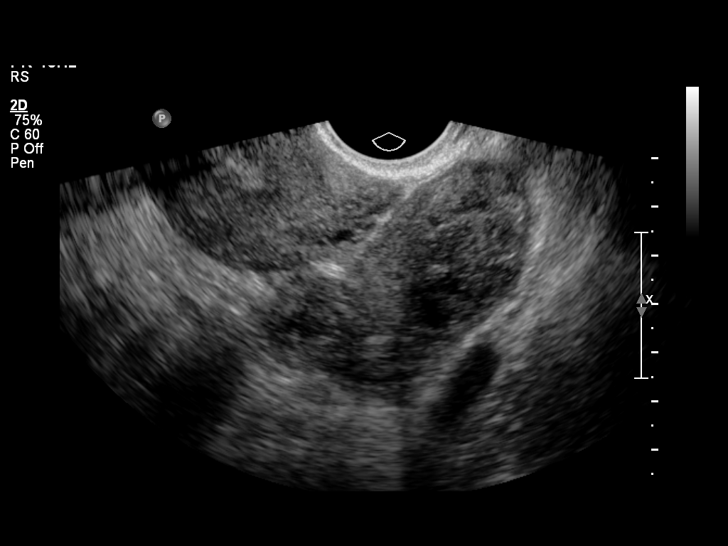
[im 34/42]
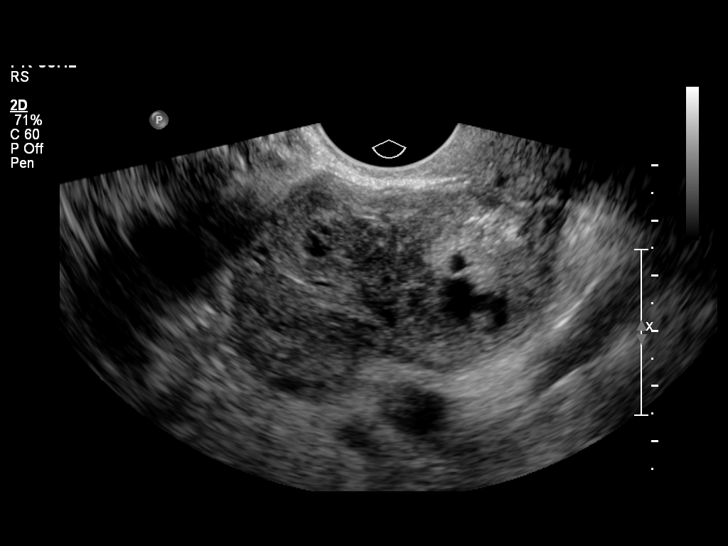
[im 37/42]
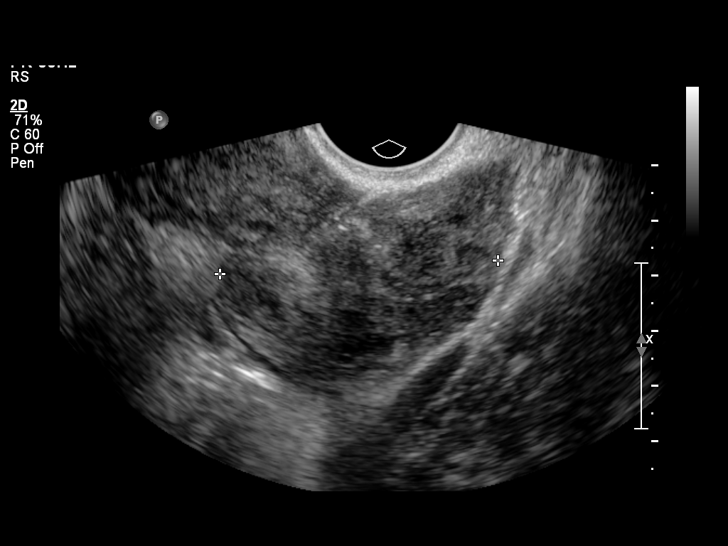
[im 40/42]
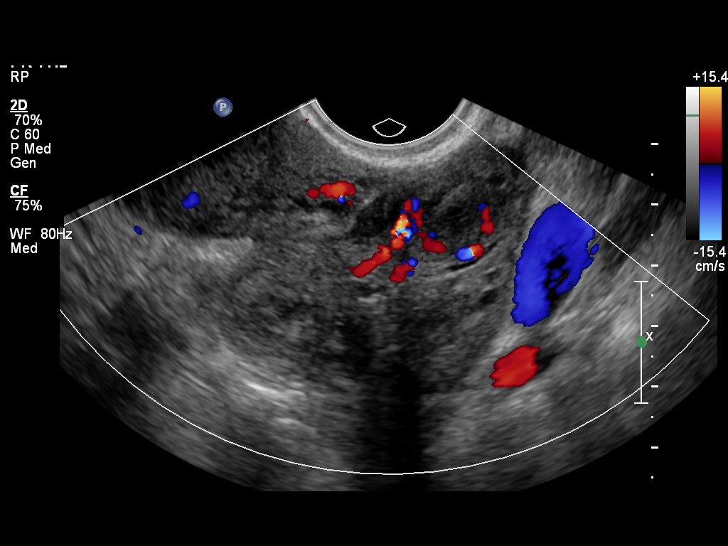

[13 of 28 positions shown; findings below may reference images not displayed]

Intrauterine gestational sac:  None-visualized

Maternal uterus/adnexae:

An intrauterine device appears appropriately located within the
anteverted uterus.  No discrete uterine mass.  Normal appearance of
the cervix.

The left ovary/adnexa is enlarged measuring approximately 6.0 x
x 5.1 cm.  A possible gestational sac is noted within the left
adnexa (images 35-37).  This finding is associated with mixed
echogenic material within the adjacent left ovary/adnexa worrisome
for hemorrhage/hematoma.  There is a trace amount of fluid in the
pelvic cul-de-sac.

Normal appearance of the right ovary, measuring approximately 4.1 x
2.0 x 1.8 cm. A small peripheral follicles noted within the right
ovary.  No discrete right adnexal mass.
IMPRESSION: 1.  Findings concerning for ectopic pregnancy within the left
adnexa.
2.  IUD device appropriately located within an otherwise normal-
appearing uterus.
3.  Small amount of free fluid within the pelvic cul-de-sac.

Above findings discussed with Steelman, [REDACTED] at [id number].

## 2014-07-15 ENCOUNTER — Encounter (HOSPITAL_COMMUNITY): Payer: Self-pay | Admitting: Emergency Medicine

## 2015-07-21 ENCOUNTER — Encounter: Payer: Managed Care, Other (non HMO) | Attending: Internal Medicine | Admitting: Dietician

## 2015-07-21 ENCOUNTER — Encounter: Payer: Self-pay | Admitting: Dietician

## 2015-07-21 VITALS — Ht 69.0 in | Wt 216.0 lb

## 2015-07-21 DIAGNOSIS — Z713 Dietary counseling and surveillance: Secondary | ICD-10-CM | POA: Insufficient documentation

## 2015-07-21 DIAGNOSIS — R7303 Prediabetes: Secondary | ICD-10-CM | POA: Insufficient documentation

## 2015-07-21 NOTE — Progress Notes (Signed)
Diabetes Self-Management Education  Visit Type: First/Initial  Appt. Start Time: 0815 Appt. End Time: 0930  07/21/2015  [name], identified by name and date of birth, is a 44 y.o. female with a diagnosis of Diabetes: Pre-Diabetes.   Patient is here alone.  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  She lives with her husband and 2 children ages 47 & 7.  Her mother has diabetes and she wishes to avoid this.  She has not been going to the gym since August due to decreased motivation.  She often skips meals due to being rushed or forgets due to  Increased responsibilities at work.    Labs: (04/2015)  HgbA1C:  6.0%, chol:  230, HDL 76, LDL 134, GFR 85.    Weight hx:  Highest 218 lbs, lowest 188 lbs 7 years ago.  She wants to lose weight.  216 lbs today.  TANITA  BODY COMP RESULTS 07/21/15 214 lbs   BMI (kg/m^2) 31.6   Fat Mass (lbs) 104   Fat Free Mass (lbs) 110   Total Body Water (lbs) 80.5    ASSESSMENT  Height  (1.753 m), weight 216 lb (97.977 kg). Body mass index is 31.88 kg/(m^2).      Diabetes Self-Management Education - 07/21/15 0817    Visit Information   Visit Type First/Initial   Initial Visit   Diabetes Type Pre-Diabetes   Health Coping   How would you rate your overall health? Good   Psychosocial Assessment   Patient Belief/Attitude about Diabetes Motivated to manage diabetes   Self-care barriers None   Other persons present Patient   Patient Concerns Weight Control;Glycemic Control;Nutrition/Meal planning   Preferred Learning Style No preference indicated   Learning Readiness Ready   How often do you need to have someone help you when you read instructions, pamphlets, or other written materials from your doctor or pharmacy? 1 - Never   What is the last grade level you completed in school? Masters in Ambulance person   Complications   Last HgB A1C per patient/outside source 6 %  8/16   How often do you  check your blood sugar? Not recommended by provider   Have you had a dilated eye exam in the past 12 months? Yes   Have you had a dental exam in the past 12 months? Yes   Are you checking your feet? N/A   Dietary Intake   Breakfast instant oatmeal or eggs, waffles, bacon, grits OR skips   Snack (morning) none   Lunch fast food to salad out to eat or skips   Snack (afternoon) chips or crackers or cookies (vending machine)   Dinner spaghetti, salad, garlic bread, salad OR Pizza OR McDonald's OR fried chicken or pork chops with rice and vegetable (greens or green beans) and dessert nightly (cake, cookies)   Snack (evening) none   Beverage(s) juice, water, sweet tea   Exercise   Exercise Type ADL's   How many days per week to you exercise? 0   How many minutes per day do you exercise? 0   Total minutes per week of exercise 0   Patient Education   Previous Diabetes Education No   Disease state  Definition of diabetes, type 1 and 2, and the diagnosis of diabetes;Factors that contribute to the development of diabetes   Nutrition management  Role of diet in the treatment of diabetes and the relationship between the three main  macronutrients and blood glucose level;Food label reading, portion sizes and measuring food.;Meal options for control of blood glucose level and chronic complications.   Physical activity and exercise  Role of exercise on diabetes management, blood pressure control and cardiac health.   Monitoring Daily foot exams;Identified appropriate SMBG and/or A1C goals.   Chronic complications Relationship between chronic complications and blood glucose control;Retinopathy and reason for yearly dilated eye exams;Dental care   Psychosocial adjustment Worked with patient to identify barriers to care and solutions;Role of stress on diabetes;Identified and addressed patients feelings and concerns about diabetes   Individualized Goals (developed by patient)   Nutrition General guidelines for  healthy choices and portions discussed   Physical Activity Exercise 5-7 days per week;30 minutes per day   Monitoring  Not Applicable   Outcomes   Expected Outcomes Demonstrated interest in learning. Expect positive outcomes   Future DMSE 3-4 months   Program Status Completed      Individualized Plan for Diabetes Self-Management Training:   Learning Objective:  Patient will have a greater understanding of diabetes self-management. Patient education plan is to attend individual and/or group sessions per assessed needs and concerns.   Plan:   Patient Instructions  Plan:  Aim for 2-3 Carb Choices per meal (30-45 grams) +/- 1 either way  Aim for 0-1 Carbs per snack if hungry  Include protein in moderation with your meals and snacks Consider reading food labels for Total Carbohydrate and Fat Grams of foods Consider  increasing your activity level by walking or going to the gym for 30-60 minutes daily as tolerated Bake rather than fry. Rethink your drinks. Take Vitamin D as prescribed. Increased non starchy vegetables Decreased sodium Breakfast, lunch, dinner daily     Expected Outcomes:  Demonstrated interest in learning. Expect positive outcomes  Education material provided: Living Well with Diabetes, Food label handouts, A1C conversion sheet, Meal plan card, My Plate and Snack sheet, breakfast ideas  If problems or questions, patient to contact team via:  Phone and Email  Future DSME appointment: 3-4 months

## 2015-07-21 NOTE — Patient Instructions (Addendum)
Plan:  Aim for 2-3 Carb Choices per meal (30-45 grams) +/- 1 either way  Aim for 0-1 Carbs per snack if hungry  Include protein in moderation with your meals and snacks Consider reading food labels for Total Carbohydrate and Fat Grams of foods Consider  increasing your activity level by walking or going to the gym for 30-60 minutes daily as tolerated Bake rather than fry. Rethink your drinks. Take Vitamin D as prescribed. Increased non starchy vegetables Decreased sodium Breakfast, lunch, dinner daily

## 2015-10-21 ENCOUNTER — Ambulatory Visit: Payer: Managed Care, Other (non HMO) | Admitting: Dietician

## 2017-12-20 DIAGNOSIS — Z0110 Encounter for examination of ears and hearing without abnormal findings: Secondary | ICD-10-CM | POA: Diagnosis not present

## 2017-12-20 DIAGNOSIS — Z6835 Body mass index (BMI) 35.0-35.9, adult: Secondary | ICD-10-CM | POA: Diagnosis not present

## 2017-12-20 DIAGNOSIS — Z111 Encounter for screening for respiratory tuberculosis: Secondary | ICD-10-CM | POA: Diagnosis not present

## 2017-12-20 DIAGNOSIS — Z0100 Encounter for examination of eyes and vision without abnormal findings: Secondary | ICD-10-CM | POA: Diagnosis not present

## 2017-12-20 DIAGNOSIS — E669 Obesity, unspecified: Secondary | ICD-10-CM | POA: Diagnosis not present

## 2017-12-20 DIAGNOSIS — I10 Essential (primary) hypertension: Secondary | ICD-10-CM | POA: Diagnosis not present

## 2018-06-21 ENCOUNTER — Ambulatory Visit: Payer: BC Managed Care – PPO | Admitting: Internal Medicine

## 2018-06-21 ENCOUNTER — Encounter: Payer: Self-pay | Admitting: Internal Medicine

## 2018-06-21 VITALS — BP 124/88 | HR 62 | Temp 98.2°F | Ht 69.0 in | Wt 235.4 lb

## 2018-06-21 DIAGNOSIS — E559 Vitamin D deficiency, unspecified: Secondary | ICD-10-CM | POA: Diagnosis not present

## 2018-06-21 DIAGNOSIS — R7309 Other abnormal glucose: Secondary | ICD-10-CM

## 2018-06-21 DIAGNOSIS — I10 Essential (primary) hypertension: Secondary | ICD-10-CM | POA: Diagnosis not present

## 2018-06-21 DIAGNOSIS — E669 Obesity, unspecified: Secondary | ICD-10-CM | POA: Diagnosis not present

## 2018-06-21 DIAGNOSIS — Z6834 Body mass index (BMI) 34.0-34.9, adult: Secondary | ICD-10-CM

## 2018-06-21 NOTE — Patient Instructions (Signed)
Obesity, Adult Obesity is having too much body fat. If you have a BMI of 30 or more, you are obese. BMI is a number that explains how much body fat you have. Obesity is often caused by taking in (consuming) more calories than your body uses. Obesity can cause serious health problems. Changing your lifestyle can help to treat obesity. Follow these instructions at home: Eating and drinking   Follow advice from your doctor about what to eat and drink. Your doctor may tell you to: ? Cut down on (limit) fast foods, sweets, and processed snack foods. ? Choose low-fat options. For example, choose low-fat milk instead of whole milk. ? Eat 5 or more servings of fruits or vegetables every day. ? Eat at home more often. This gives you more control over what you eat. ? Choose healthy foods when you eat out. ? Learn what a healthy portion size is. A portion size is the amount of a certain food that is healthy for you to eat at one time. This is different for each person. ? Keep low-fat snacks available. ? Avoid sugary drinks. These include soda, fruit juice, iced tea that is sweetened with sugar, and flavored milk. ? Eat a healthy breakfast.  Drink enough water to keep your pee (urine) clear or pale yellow.  Do not go without eating for long periods of time (do not fast).  Do not go on popular or trendy diets (fad diets). Physical Activity  Exercise often, as told by your doctor. Ask your doctor: ? What types of exercise are safe for you. ? How often you should exercise.  Warm up and stretch before being active.  Do slow stretching after being active (cool down).  Rest between times of being active. Lifestyle  Limit how much time you spend in front of your TV, computer, or video game system (be less sedentary).  Find ways to reward yourself that do not involve food.  Limit alcohol intake to no more than 1 drink a day for nonpregnant women and 2 drinks a day for men. One drink equals 12 oz  of beer, 5 oz of wine, or 1 oz of hard liquor. General instructions  Keep a weight loss journal. This can help you keep track of: ? The food that you eat. ? The exercise that you do.  Take over-the-counter and prescription medicines only as told by your doctor.  Take vitamins and supplements only as told by your doctor.  Think about joining a support group. Your doctor may be able to help with this.  Keep all follow-up visits as told by your doctor. This is important. Contact a doctor if:  You cannot meet your weight loss goal after you have changed your diet and lifestyle for 6 weeks. This information is not intended to replace advice given to you by your health care provider. Make sure you discuss any questions you have with your health care provider. Document Released: 11/22/2011 Document Revised: 02/05/2016 Document Reviewed: 06/18/2015 Elsevier Interactive Patient Education  2018 Elsevier Inc.  

## 2018-06-21 NOTE — Progress Notes (Addendum)
  Subjective:     Patient ID: Jody [name] , female    DOB: 21-Apr-1971 , 47 y.o.   MRN: [id number]   Hypertension  This is a recurrent problem. The current episode started more than 1 year ago. The problem is unchanged. The problem is controlled. Pertinent negatives include no chest pain, headaches, orthopnea or palpitations.     Past Medical History:  Diagnosis Date  . H/O amenorrhea   . History of chicken pox   . Hx of mumps   . Hyperlipidemia   . Hypertension   . Low iron   . Prediabetes   . Yeast infection       Current Outpatient Medications:  .  lisinopril-hydrochlorothiazide (PRINZIDE,ZESTORETIC) 20-12.5 MG tablet, Take 1 tablet by mouth daily., Disp: , Rfl:    Review of Systems  Constitutional: Positive for unexpected weight change (SHE HAS HAD DIFFICULT W/ WEIGHT LOSS. INTERESTED IN WEIGHT LOSS SURGERY. ).  HENT: Negative.   Eyes: Negative.   Respiratory: Negative.   Cardiovascular: Negative.  Negative for chest pain, palpitations and orthopnea.  Gastrointestinal: Negative.   Neurological: Negative for headaches.  Psychiatric/Behavioral: Negative.      Today's Vitals   06/21/18 0852  BP: 124/88  Pulse: 62  Temp: 98.2 F (36.8 C)  TempSrc: Oral  Weight: 235 lb 6.4 oz (106.8 kg)  Height: '5\' 9"'$  (1.753 m)   Body mass index is 34.76 kg/m.   Objective:  Physical Exam  Constitutional: She appears well-developed and well-nourished.  Eyes: EOM are normal.  Neck: Normal range of motion. Neck supple.  Cardiovascular: Normal rate, regular rhythm and normal heart sounds.  Pulmonary/Chest: Effort normal and breath sounds normal.  Psychiatric: She has a normal mood and affect.  Nursing note and vitals reviewed.       Assessment And Plan:     1. Essential hypertension, benign CONTROLLED. SHE WILL CONTINUE WITH CURRENT MEDS. SHE IS ENCOURAGED TO LIMIT HER SALT INTAKE AND TO EXERCISE NO LESS THAN FIVE DAYS WEEKLY. SHE WILL RTO IN SIX MONTHS FOR A FULL  PHYSICAL EXAMINATION.   - CMP14+EGFR - Lipid Profile  2. Other abnormal glucose HER A1C HAS BEEN ELEVATED IN THE PAST. I WILL CHECK AN A1C, BMET TODAY. SHE WAS ENCOURAGED TO AVOID SUGARY BEVERAGES AND PROCESSED FOODS INCLUDNG BREADS, RICE AND PASTA.  - Hemoglobin A1c  3. Vitamin D deficiency disease I WILL CHECK A VIT D LEVEL AND SUPPLEMENT AS NEEDED.  ALSO ENCOURAGED TO SPEND 15 MINUTES IN THE SUN DAILY.  - Vitamin D (25 hydroxy)  4. Adult BMI 34.0-34.9 kg/sq m SHE IS ENCOURAGED TO STRIVE FOR BMI LESS THAN 30 TO DECREASE CARDIAC RISK. SHE IS INTERESTED IN BARIATRIC SURGERY. SHE HAS ALREADY ATTENDED [facility] ONLINE ORIENTATION SESSION. PT ADVISED THAT I WOULD WRITE LETTER OF SUPPORT FOR HER.   5. Adult-onset obesity         Maximino Greenland, MD

## 2018-06-22 LAB — CMP14+EGFR
ALT: 10 IU/L (ref 0–32)
AST: 11 IU/L (ref 0–40)
Albumin/Globulin Ratio: 1.5 (ref 1.2–2.2)
Albumin: 4.3 g/dL (ref 3.5–5.5)
Alkaline Phosphatase: 65 IU/L (ref 39–117)
BUN/Creatinine Ratio: 16 (ref 9–23)
BUN: 13 mg/dL (ref 6–24)
Bilirubin Total: <0.2 mg/dL (ref 0.0–1.2)
CALCIUM: 9.6 mg/dL (ref 8.7–10.2)
CHLORIDE: 104 mmol/L (ref 96–106)
CO2: 23 mmol/L (ref 20–29)
Creatinine, Ser: 0.83 mg/dL (ref 0.57–1.00)
GFR calc Af Amer: 98 mL/min/{1.73_m2} (ref >59)
GFR, EST NON AFRICAN AMERICAN: 85 mL/min/{1.73_m2} (ref >59)
GLUCOSE: 98 mg/dL (ref 65–99)
Globulin, Total: 2.9 g/dL (ref 1.5–4.5)
Potassium: 4.3 mmol/L (ref 3.5–5.2)
Sodium: 141 mmol/L (ref 134–144)
Total Protein: 7.2 g/dL (ref 6.0–8.5)

## 2018-06-22 LAB — HEMOGLOBIN A1C
Est. average glucose Bld gHb Est-mCnc: 120 mg/dL
Hgb A1c MFr Bld: 5.8 % — ABNORMAL HIGH (ref 4.8–5.6)

## 2018-06-22 LAB — LIPID PANEL
Chol/HDL Ratio: 3.1 ratio (ref 0.0–4.4)
Cholesterol, Total: 193 mg/dL (ref 100–199)
HDL: 62 mg/dL (ref >39)
LDL Calculated: 116 mg/dL — ABNORMAL HIGH (ref 0–99)
TRIGLYCERIDES: 73 mg/dL (ref 0–149)
VLDL Cholesterol Cal: 15 mg/dL (ref 5–40)

## 2018-06-22 LAB — VITAMIN D 25 HYDROXY (VIT D DEFICIENCY, FRACTURES): Vit D, 25-Hydroxy: 21.5 ng/mL — ABNORMAL LOW (ref 30.0–100.0)

## 2018-06-22 NOTE — Progress Notes (Signed)
Your kidney function is stable. Your liver function is stable. Your LDL, bad cholesterol is 116. Ideally, this should be less than 100.  I suggest you exercise no less than five days weekly and avoid fried foods. It is important to also cut back on your sugar intake. Your vitamin D level is low. I suggest you start 5000 unit capsules vit d3 once daily. You are prediabetic with a1c 5.8.

## 2018-07-19 ENCOUNTER — Telehealth: Payer: Self-pay

## 2018-07-19 NOTE — Telephone Encounter (Signed)
Left the pt a message that her requested letter has been faxed to [facility].

## 2018-08-02 ENCOUNTER — Encounter: Payer: Self-pay | Admitting: Internal Medicine

## 2018-10-30 LAB — HM MAMMOGRAPHY

## 2018-11-02 ENCOUNTER — Encounter: Payer: Self-pay | Admitting: Internal Medicine

## 2018-11-02 ENCOUNTER — Telehealth: Payer: Self-pay

## 2018-11-02 ENCOUNTER — Ambulatory Visit: Payer: BC Managed Care – PPO | Admitting: Internal Medicine

## 2018-11-02 VITALS — BP 116/82 | HR 63 | Temp 97.9°F | Ht 67.0 in | Wt 234.8 lb

## 2018-11-02 DIAGNOSIS — I10 Essential (primary) hypertension: Secondary | ICD-10-CM

## 2018-11-02 DIAGNOSIS — E669 Obesity, unspecified: Secondary | ICD-10-CM | POA: Diagnosis not present

## 2018-11-02 DIAGNOSIS — R7309 Other abnormal glucose: Secondary | ICD-10-CM

## 2018-11-02 DIAGNOSIS — E7800 Pure hypercholesterolemia, unspecified: Secondary | ICD-10-CM

## 2018-11-02 DIAGNOSIS — R7303 Prediabetes: Secondary | ICD-10-CM

## 2018-11-02 DIAGNOSIS — E559 Vitamin D deficiency, unspecified: Secondary | ICD-10-CM | POA: Diagnosis not present

## 2018-11-02 DIAGNOSIS — Z760 Encounter for issue of repeat prescription: Secondary | ICD-10-CM

## 2018-11-02 DIAGNOSIS — Z6836 Body mass index (BMI) 36.0-36.9, adult: Secondary | ICD-10-CM

## 2018-11-02 MED ORDER — PHENTERMINE HCL 37.5 MG PO CAPS: 37.5000 mg | ORAL_CAPSULE | ORAL | 0 refills | Status: DC

## 2018-11-02 NOTE — Progress Notes (Signed)
Subjective:     Patient ID: Jody [name] , female    DOB: 1971/07/26 , 48 y.o.   MRN: [id number]   Chief Complaint  Patient presents with  . Obesity    Phentermine refill    HPI Pt. Has not been taking Phentermine since last year and would like to get back on it. [facility] [employer] and is starting to exercise 3 times a week 30 min cardio type exercises. And will try slim fast program before spending money on wt loss programs.  She tolerated the phentermine well and is able to say no to temptation and makes better choices. LMP now. She used to see Dr Allyne Gee every 3 months when she prescribed her Phentermine in the past.  GOAL- 180 lb Highest wt- 240 lb Lowest wt- 176 lb Wt loss programs- [employer] and [employer].  Wt loss medications tried.- Phentermine and  Saxenda, but insurance would not cover the saxenda.  Eating disorders- neg, denies binging,  tends to eat when not hungry when not on phentermine. Tends to be a social eater or if there is food sitting around available.    Past Medical History:  Diagnosis Date  . H/O amenorrhea   . History of chicken pox   . Hx of mumps   . Hyperlipidemia   . Hypertension   . Low iron   . Prediabetes   . Yeast infection      Family History  Problem Relation Age of Onset  . Hypertension Mother   . Diabetes Mother   . Hypertension Maternal Grandmother      Current Outpatient Medications:  .  lisinopril-hydrochlorothiazide (PRINZIDE,ZESTORETIC) 20-12.5 MG tablet, Take 1 tablet by mouth daily., Disp: , Rfl:    No Known Allergies   Review of Systems  Constitutional: Negative for appetite change, chills, diaphoresis and fatigue.  HENT: Negative for congestion, postnasal drip and rhinorrhea.   Respiratory: Negative for chest tightness and shortness of breath.   Cardiovascular: Negative for chest pain, palpitations and leg swelling.  Gastrointestinal: Negative for abdominal distention and abdominal pain.  Endocrine: Negative for  polydipsia and polyphagia.  Genitourinary: Negative for difficulty urinating.  Musculoskeletal: Negative for arthralgias and myalgias.  Skin: Negative for rash.  Neurological: Negative for headaches.  Hematological: Negative for adenopathy.     Today's Vitals   11/02/18 0953  BP: 116/82  Pulse: 63  Temp: 97.9 F (36.6 C)  TempSrc: Oral  Weight: 234 lb 12.8 oz (106.5 kg)  Height: 5\' 7"  (1.702 m)   Body mass index is 36.77 kg/m.   Objective:  Physical Exam  She has only lost 0.66 lbs since October.  Constitutional: She is oriented to person, place, and time. She appears well-developed and well-nourished. No distress.  HENT:  Head: Normocephalic and atraumatic.  Right Ear: External ear normal.  Left Ear: External ear normal.  Nose: Nose normal.  Eyes: Conjunctivae are normal. Right eye exhibits no discharge. Left eye exhibits no discharge. No scleral icterus.  Neck: Neck supple. No thyromegaly present.  No carotid bruits bilaterally  Cardiovascular: Normal rate and regular rhythm.  No murmur heard. Pulmonary/Chest: Effort normal and breath sounds normal. No respiratory distress.  Musculoskeletal: Normal range of motion. She exhibits no edema.  Lymphadenopathy:    She has no cervical adenopathy.  Neurological: She is alert and oriented to person, place, and time.  Skin: Skin is warm and dry. Capillary refill takes less than 2 seconds. No rash noted. She is not diaphoretic.  Psychiatric:  She has a normal mood and affect. Her behavior is normal. Judgment and thought content normal.  Nursing note reviewed.  Assessment And Plan:    1. Essential hypertension, benign- stable - CMP14 + Anion Gap - CBC no Diff 3. Vitamin D deficiency disease- unknown status. Has not been taking Vit D since December - Vitamin D 1,25 Dihydroxy  4. Adult-onset obesity- chronic - phentermine 37.5 MG capsule; Take 1 capsule (37.5 mg total) by mouth every morning.  Dispense: 30 capsule; Refill:  0  5. Prediabetes- chronic. We will see the status of her DM test.  - Hemoglobin A1c  6. Elevated LDL cholesterol level- chronic. Has been on low fat diet.  - Lipid Profile  7. Body mass index 36.0-36.9, adult- worse than prior BMI. Weight loss med and diet will be started.   8. Class 2 severe obesity due to excess calories with serious comorbidity in adult, unspecified BMI (HCC)- working on wt loss.  I explained to her that I see pt's on phentermine and wt loss every month, but she may Fu with Dr Allyne Gee in 3 months for HTN and Wt check.  I also suggested to her to be looking into long term meds for wt loss since she tends to gain wt back when she d/c the phentermine.      Perris Tripathi RODRIGUEZ-SOUTHWORTH, PA-C

## 2018-11-02 NOTE — Telephone Encounter (Signed)
error 

## 2018-11-02 NOTE — Patient Instructions (Signed)
LOOK INTO LONG TERM WT LOSS MEDS  AND WHICH ONES  YOU WOULD LIKE TO TRY AND WHICH ONES YOUR INUSURANCE COVERS.   QSYMIA CONTRAVE BELVIQ SAXENDA

## 2018-11-03 LAB — HEMOGLOBIN A1C
Est. average glucose Bld gHb Est-mCnc: 123 mg/dL
Hgb A1c MFr Bld: 5.9 % — ABNORMAL HIGH (ref 4.8–5.6)

## 2018-11-03 LAB — CMP14 + ANION GAP
A/G RATIO: 1.4 (ref 1.2–2.2)
ALT: 14 IU/L (ref 0–32)
AST: 16 IU/L (ref 0–40)
Albumin: 4.5 g/dL (ref 3.8–4.8)
Alkaline Phosphatase: 72 IU/L (ref 39–117)
Anion Gap: 16 mmol/L (ref 10.0–18.0)
BUN/Creatinine Ratio: 11 (ref 9–23)
BUN: 9 mg/dL (ref 6–24)
Bilirubin Total: 0.2 mg/dL (ref 0.0–1.2)
CALCIUM: 9.7 mg/dL (ref 8.7–10.2)
CO2: 22 mmol/L (ref 20–29)
Chloride: 102 mmol/L (ref 96–106)
Creatinine, Ser: 0.82 mg/dL (ref 0.57–1.00)
GFR calc Af Amer: 99 mL/min/{1.73_m2} (ref >59)
GFR, EST NON AFRICAN AMERICAN: 86 mL/min/{1.73_m2} (ref >59)
GLOBULIN, TOTAL: 3.2 g/dL (ref 1.5–4.5)
Glucose: 88 mg/dL (ref 65–99)
POTASSIUM: 4.3 mmol/L (ref 3.5–5.2)
SODIUM: 140 mmol/L (ref 134–144)
TOTAL PROTEIN: 7.7 g/dL (ref 6.0–8.5)

## 2018-11-03 LAB — CBC
Hematocrit: 37.3 % (ref 34.0–46.6)
Hemoglobin: 12.3 g/dL (ref 11.1–15.9)
MCH: 25.7 pg — AB (ref 26.6–33.0)
MCHC: 33 g/dL (ref 31.5–35.7)
MCV: 78 fL — ABNORMAL LOW (ref 79–97)
Platelets: 405 10*3/uL (ref 150–450)
RBC: 4.79 x10E6/uL (ref 3.77–5.28)
RDW: 13.1 % (ref 11.7–15.4)
WBC: 5.9 10*3/uL (ref 3.4–10.8)

## 2018-11-03 LAB — LIPID PANEL
CHOL/HDL RATIO: 2.9 ratio (ref 0.0–4.4)
Cholesterol, Total: 190 mg/dL (ref 100–199)
HDL: 65 mg/dL (ref >39)
LDL CALC: 110 mg/dL — AB (ref 0–99)
TRIGLYCERIDES: 75 mg/dL (ref 0–149)
VLDL Cholesterol Cal: 15 mg/dL (ref 5–40)

## 2018-11-03 LAB — VITAMIN D 25 HYDROXY (VIT D DEFICIENCY, FRACTURES): VIT D 25 HYDROXY: 19.3 ng/mL — AB (ref 30.0–100.0)

## 2018-11-08 ENCOUNTER — Other Ambulatory Visit: Payer: Self-pay | Admitting: Internal Medicine

## 2018-11-08 MED ORDER — VITAMIN D (ERGOCALCIFEROL) 1.25 MG (50000 UNIT) PO CAPS
ORAL_CAPSULE | ORAL | 1 refills | Status: DC
Start: 2018-11-08 — End: 2019-07-11

## 2018-11-08 NOTE — Progress Notes (Signed)
Vit D rx sent.

## 2018-12-07 ENCOUNTER — Other Ambulatory Visit: Payer: Self-pay | Admitting: Internal Medicine

## 2018-12-15 ENCOUNTER — Encounter: Payer: Self-pay | Admitting: Internal Medicine

## 2018-12-16 ENCOUNTER — Ambulatory Visit: Payer: BC Managed Care – PPO | Admitting: Internal Medicine

## 2018-12-18 ENCOUNTER — Ambulatory Visit: Payer: BC Managed Care – PPO | Admitting: Internal Medicine

## 2019-01-16 ENCOUNTER — Other Ambulatory Visit: Payer: Self-pay | Admitting: Internal Medicine

## 2019-07-05 ENCOUNTER — Ambulatory Visit: Payer: BC Managed Care – PPO | Admitting: Nurse Practitioner

## 2019-07-05 ENCOUNTER — Other Ambulatory Visit: Payer: Self-pay

## 2019-07-05 ENCOUNTER — Encounter: Payer: Self-pay | Admitting: Nurse Practitioner

## 2019-07-05 VITALS — BP 112/80 | HR 64 | Temp 98.7°F | Ht 68.8 in | Wt 235.2 lb

## 2019-07-05 DIAGNOSIS — Z23 Encounter for immunization: Secondary | ICD-10-CM

## 2019-07-05 DIAGNOSIS — I10 Essential (primary) hypertension: Secondary | ICD-10-CM | POA: Diagnosis not present

## 2019-07-05 DIAGNOSIS — Z139 Encounter for screening, unspecified: Secondary | ICD-10-CM | POA: Diagnosis not present

## 2019-07-05 DIAGNOSIS — Z6834 Body mass index (BMI) 34.0-34.9, adult: Secondary | ICD-10-CM

## 2019-07-05 DIAGNOSIS — E559 Vitamin D deficiency, unspecified: Secondary | ICD-10-CM | POA: Diagnosis not present

## 2019-07-05 DIAGNOSIS — Z0000 Encounter for general adult medical examination without abnormal findings: Secondary | ICD-10-CM | POA: Diagnosis not present

## 2019-07-05 DIAGNOSIS — R7303 Prediabetes: Secondary | ICD-10-CM

## 2019-07-05 DIAGNOSIS — E6609 Other obesity due to excess calories: Secondary | ICD-10-CM

## 2019-07-05 LAB — POCT URINALYSIS DIPSTICK
Bilirubin, UA: NEGATIVE
Blood, UA: NEGATIVE
Glucose, UA: NEGATIVE
Ketones, UA: NEGATIVE
Nitrite, UA: NEGATIVE
Protein, UA: NEGATIVE
Spec Grav, UA: 1.02 (ref 1.010–1.025)
Urobilinogen, UA: 0.2 E.U./dL
pH, UA: 6 (ref 5.0–8.0)

## 2019-07-05 LAB — POCT UA - MICROALBUMIN
Albumin/Creatinine Ratio, Urine, POC: 30
Creatinine, POC: 200 mg/dL
Microalbumin Ur, POC: 10 mg/L

## 2019-07-05 MED ORDER — CONTRAVE 8-90 MG PO TB12: ORAL_TABLET | ORAL | 1 refills | Status: DC

## 2019-07-05 NOTE — Patient Instructions (Addendum)
Health Maintenance  Topic Date Due  . PAP SMEAR-Modifier  10/30/2021  . TETANUS/TDAP  01/12/2024  . INFLUENZA VACCINE  Completed  . HIV Screening  Completed   Health Maintenance, Female Adopting a healthy lifestyle and getting preventive care are important in promoting health and wellness. Ask your health care provider about:  The right schedule for you to have regular tests and exams.  Things you can do on your own to prevent diseases and keep yourself healthy. What should I know about diet, weight, and exercise? Eat a healthy diet   Eat a diet that includes plenty of vegetables, fruits, low-fat dairy products, and lean protein.  Do not eat a lot of foods that are high in solid fats, added sugars, or sodium. Maintain a healthy weight Body mass index (BMI) is used to identify weight problems. It estimates body fat based on height and weight. Your health care provider can help determine your BMI and help you achieve or maintain a healthy weight. Get regular exercise Get regular exercise. This is one of the most important things you can do for your health. Most adults should:  Exercise for at least 150 minutes each week. The exercise should increase your heart rate and make you sweat (moderate-intensity exercise).  Do strengthening exercises at least twice a week. This is in addition to the moderate-intensity exercise.  Spend less time sitting. Even light physical activity can be beneficial. Watch cholesterol and blood lipids Have your blood tested for lipids and cholesterol at 47 years of age, then have this test every 5 years. Have your cholesterol levels checked more often if:  Your lipid or cholesterol levels are high.  You are older than 48 years of age.  You are at high risk for heart disease. What should I know about cancer screening? Depending on your health history and family history, you may need to have cancer screening at various ages. This may include screening for:   Breast cancer.  Cervical cancer.  Colorectal cancer.  Skin cancer.  Lung cancer. What should I know about heart disease, diabetes, and high blood pressure? Blood pressure and heart disease  High blood pressure causes heart disease and increases the risk of stroke. This is more likely to develop in people who have high blood pressure readings, are of African descent, or are overweight.  Have your blood pressure checked: ? Every 3-5 years if you are 88-39 years of age. ? Every year if you are 61 years old or older. Diabetes Have regular diabetes screenings. This checks your fasting blood sugar level. Have the screening done:  Once every three years after age 49 if you are at a normal weight and have a low risk for diabetes.  More often and at a younger age if you are overweight or have a high risk for diabetes. What should I know about preventing infection? Hepatitis B If you have a higher risk for hepatitis B, you should be screened for this virus. Talk with your health care provider to find out if you are at risk for hepatitis B infection. Hepatitis C Testing is recommended for:  Everyone born from [birth year] through 1964.  Anyone with known risk factors for hepatitis C. Sexually transmitted infections (STIs)  Get screened for STIs, including gonorrhea and chlamydia, if: ? You are sexually active and are younger than 47 years of age. ? You are older than 47 years of age and your health care provider tells you that you are at risk for  this type of infection. ? Your sexual activity has changed since you were last screened, and you are at increased risk for chlamydia or gonorrhea. Ask your health care provider if you are at risk.  Ask your health care provider about whether you are at high risk for HIV. Your health care provider may recommend a prescription medicine to help prevent HIV infection. If you choose to take medicine to prevent HIV, you should first get tested for HIV. You  should then be tested every 3 months for as long as you are taking the medicine. Pregnancy  If you are about to stop having your period (premenopausal) and you may become pregnant, seek counseling before you get pregnant.  Take 400 to 800 micrograms (mcg) of folic acid every day if you become pregnant.  Ask for birth control (contraception) if you want to prevent pregnancy. Osteoporosis and menopause Osteoporosis is a disease in which the bones lose minerals and strength with aging. This can result in bone fractures. If you are 178 years old or older, or if you are at risk for osteoporosis and fractures, ask your health care provider if you should:  Be screened for bone loss.  Take a calcium or vitamin D supplement to lower your risk of fractures.  Be given hormone replacement therapy (HRT) to treat symptoms of menopause. Follow these instructions at home: Lifestyle  Do not use any products that contain nicotine or tobacco, such as cigarettes, e-cigarettes, and chewing tobacco. If you need help quitting, ask your health care provider.  Do not use street drugs.  Do not share needles.  Ask your health care provider for help if you need support or information about quitting drugs. Alcohol use  Do not drink alcohol if: ? Your health care provider tells you not to drink. ? You are pregnant, may be pregnant, or are planning to become pregnant.  If you drink alcohol: ? Limit how much you use to 0-1 drink a day. ? Limit intake if you are breastfeeding.  Be aware of how much alcohol is in your drink. In the [place] one drink equals one 12 oz bottle of beer (355 mL), one 5 oz glass of wine (148 mL), or one 1 oz glass of hard liquor (44 mL). General instructions  Schedule regular health, dental, and eye exams.  Stay current with your vaccines.  Tell your health care provider if: ? You often feel depressed. ? You have ever been abused or do not feel safe at home. Summary  Adopting a  healthy lifestyle and getting preventive care are important in promoting health and wellness.  Follow your health care provider's instructions about healthy diet, exercising, and getting tested or screened for diseases.  Follow your health care provider's instructions on monitoring your cholesterol and blood pressure. This information is not intended to replace advice given to you by your health care provider. Make sure you discuss any questions you have with your health care provider. Document Released: 03/15/2011 Document Revised: 08/23/2018 Document Reviewed: 08/23/2018 Elsevier Patient Education  2020 Else[employer] Inc.  Influenza Virus Vaccine (Flucelvax) What is this medicine? INFLUENZA VIRUS VACCINE (in floo EN zuh VAHY ruhs vak SEEN) helps to reduce the risk of getting influenza also known as the flu. The vaccine only helps protect you against some strains of the flu. This medicine may be used for other purposes; ask your health care provider or pharmacist if you have questions. COMMON BRAND NAME(S): FLUCELVAX What should I tell my health care  provider before I take this medicine? They need to know if you have any of these conditions:  bleeding disorder like hemophilia  fever or infection  Guillain-Barre syndrome or other neurological problems  immune system problems  infection with the human immunodeficiency virus (HIV) or AIDS  low blood platelet counts  multiple sclerosis  an unusual or allergic reaction to influenza virus vaccine, other medicines, foods, dyes or preservatives  pregnant or trying to get pregnant  breast-feeding How should I use this medicine? This vaccine is for injection into a muscle. It is given by a health care professional. A copy of Vaccine Information Statements will be given before each vaccination. Read this sheet carefully each time. The sheet may change frequently. Talk to your pediatrician regarding the use of this medicine in children.  Special care may be needed. Overdosage: If you think you've taken too much of this medicine contact a poison control center or emergency room at once. Overdosage: If you think you have taken too much of this medicine contact a poison control center or emergency room at once. NOTE: This medicine is only for you. Do not share this medicine with others. What if I miss a dose? This does not apply. What may interact with this medicine?  chemotherapy or radiation therapy  medicines that lower your immune system like etanercept, anakinra, infliximab, and adalimumab  medicines that treat or prevent blood clots like warfarin  phenytoin  steroid medicines like prednisone or cortisone  theophylline  vaccines This list may not describe all possible interactions. Give your health care provider a list of all the medicines, herbs, non-prescription drugs, or dietary supplements you use. Also tell them if you smoke, drink alcohol, or use illegal drugs. Some items may interact with your medicine. What should I watch for while using this medicine? Report any side effects that do not go away within 3 days to your doctor or health care professional. Call your health care provider if any unusual symptoms occur within 6 weeks of receiving this vaccine. You may still catch the flu, but the illness is not usually as bad. You cannot get the flu from the vaccine. The vaccine will not protect against colds or other illnesses that may cause fever. The vaccine is needed every year. What side effects may I notice from receiving this medicine? Side effects that you should report to your doctor or health care professional as soon as possible:  allergic reactions like skin rash, itching or hives, swelling of the face, lips, or tongue Side effects that usually do not require medical attention (Report these to your doctor or health care professional if they continue or are bothersome.):  fever  headache  muscle aches  and pains  pain, tenderness, redness, or swelling at the injection site  tiredness This list may not describe all possible side effects. Call your doctor for medical advice about side effects. You may report side effects to FDA at 1-800-FDA-1088. Where should I keep my medicine? The vaccine will be given by a health care professional in a clinic, pharmacy, doctor's office, or other health care setting. You will not be given vaccine doses to store at home. NOTE: This sheet is a summary. It may not cover all possible information. If you have questions about this medicine, talk to your doctor, pharmacist, or health care provider.  2020 Elsevier/Gold Standard (2011-08-11 14:06:47)   Exercising to Lose Weight Exercise is structured, repetitive physical activity to improve fitness and health. Getting regular exercise is  important for everyone. It is especially important if you are overweight. Being overweight increases your risk of heart disease, stroke, diabetes, high blood pressure, and several types of cancer. Reducing your calorie intake and exercising can help you lose weight. Exercise is usually categorized as moderate or vigorous intensity. To lose weight, most people need to do a certain amount of moderate-intensity or vigorous-intensity exercise each week. Moderate-intensity exercise  Moderate-intensity exercise is any activity that gets you moving enough to burn at least three times more energy (calories) than if you were sitting. Examples of moderate exercise include:  Walking a mile in 15 minutes.  Doing light yard work.  Biking at an easy pace. Most people should get at least 150 minutes (2 hours and 30 minutes) a week of moderate-intensity exercise to maintain their body weight. Vigorous-intensity exercise Vigorous-intensity exercise is any activity that gets you moving enough to burn at least six times more calories than if you were sitting. When you exercise at this intensity,  you should be working hard enough that you are not able to carry on a conversation. Examples of vigorous exercise include:  Running.  Playing a team sport, such as football, basketball, and soccer.  Jumping rope. Most people should get at least 75 minutes (1 hour and 15 minutes) a week of vigorous-intensity exercise to maintain their body weight. How can exercise affect me? When you exercise enough to burn more calories than you eat, you lose weight. Exercise also reduces body fat and builds muscle. The more muscle you have, the more calories you burn. Exercise also:  Improves mood.  Reduces stress and tension.  Improves your overall fitness, flexibility, and endurance.  Increases bone strength. The amount of exercise you need to lose weight depends on:  Your age.  The type of exercise.  Any health conditions you have.  Your overall physical ability. Talk to your health care provider about how much exercise you need and what types of activities are safe for you. What actions can I take to lose weight? Nutrition   Make changes to your diet as told by your health care provider or diet and nutrition specialist (dietitian). This may include: ? Eating fewer calories. ? Eating more protein. ? Eating less unhealthy fats. ? Eating a diet that includes fresh fruits and vegetables, whole grains, low-fat dairy products, and lean protein. ? Avoiding foods with added fat, salt, and sugar.  Drink plenty of water while you exercise to prevent dehydration or heat stroke. Activity  Choose an activity that you enjoy and set realistic goals. Your health care provider can help you make an exercise plan that works for you.  Exercise at a moderate or vigorous intensity most days of the week. ? The intensity of exercise may vary from person to person. You can tell how intense a workout is for you by paying attention to your breathing and heartbeat. Most people will notice their breathing and  heartbeat get faster with more intense exercise.  Do resistance training twice each week, such as: ? Push-ups. ? Sit-ups. ? Lifting weights. ? Using resistance bands.  Getting short amounts of exercise can be just as helpful as long structured periods of exercise. If you have trouble finding time to exercise, try to include exercise in your daily routine. ? Get up, stretch, and walk around every 30 minutes throughout the day. ? Go for a walk during your lunch break. ? Park your car farther away from your destination. ? If you take  public transportation, get off one stop early and walk the rest of the way. ? Make phone calls while standing up and walking around. ? Take the stairs instead of elevators or escalators.  Wear comfortable clothes and shoes with good support.  Do not exercise so much that you hurt yourself, feel dizzy, or get very short of breath. Where to find more information  [place] Department of Health and Human Services: [employer]at  Centers for Disease Control and Prevention (CDC): http://www.wolf.info/ Contact a health care provider:  Before starting a new exercise program.  If you have questions or concerns about your weight.  If you have a medical problem that keeps you from exercising. Get help right away if you have any of the following while exercising:  Injury.  Dizziness.  Difficulty breathing or shortness of breath that does not go away when you stop exercising.  Chest pain.  Rapid heartbeat. Summary  Being overweight increases your risk of heart disease, stroke, diabetes, high blood pressure, and several types of cancer.  Losing weight happens when you burn more calories than you eat.  Reducing the amount of calories you eat in addition to getting regular moderate or vigorous exercise each week helps you lose weight. This information is not intended to replace advice given to you by your health care provider. Make sure you discuss any questions you have  with your health care provider. Document Released: 10/02/2010 Document Revised: 09/12/2017 Document Reviewed: 09/12/2017 Elsevier Patient Education  2020 [employer].

## 2019-07-05 NOTE — Progress Notes (Signed)
Subjective:     Patient ID: Jody [name] , female    DOB: 1971/08/07 , 48 y.o.   MRN: [id number]   Chief Complaint  Patient presents with  . Annual Exam    HPI  Here for HM  Working from home currently.   Wt Readings from Last 3 Encounters: 07/05/19 : 235 lb 3.2 oz (106.7 kg) 11/02/18 : 234 lb 12.8 oz (106.5 kg) 06/21/18 : 235 lb 6.4 oz (106.8 kg)  She had been taking phentermine until covid and did not follow up.  Her goal is to get her weight lower than 200 lbs.  She has tried Phentermine, Bernie Covey (was too expensive) and Qsymia.  She reports losing a small amount of weight with phentermine.  She tries to eat breakfast, trying to eliminate carbs and sugars.  Trying to avoid fried foods. When she exercises she is walking mostly.     The patient states she uses none for birth control.  Patient's last menstrual period was 06/19/2019 (approximate).. Negative for Dysmenorrhea and Negative for Menorrhagia Mammogram last done 10/30/2018.  Negative for: breast discharge, breast lump(s), breast pain and breast self exam.  Pertinent negatives include abnormal bleeding (hematology), anxiety, decreased libido, depression, difficulty falling sleep, dyspareunia, history of infertility, nocturia, sexual dysfunction, sleep disturbances, urinary incontinence, urinary urgency, vaginal discharge and vaginal itching. Diet regular. The patient states her exercise level is  minimal.       The patient's tobacco use is:  Social History   Tobacco Use  Smoking Status Never Smoker  Smokeless Tobacco Never Used   She has been exposed to passive smoke. The patient's alcohol use is:  Social History   Substance and Sexual Activity  Alcohol Use No  . Alcohol/week: 1.0 standard drinks  . Types: 1 Standard drinks or equivalent per week   Additional information: Last pap 10/30/2018.  - Dr Jaymes Graff Past Medical History:  Diagnosis Date  . H/O amenorrhea   . History of chicken pox   . Hx of mumps    . Hyperlipidemia   . Hypertension   . Low iron   . Prediabetes   . Yeast infection      Family History  Problem Relation Age of Onset  . Hypertension Mother   . Diabetes Mother   . Hypertension Maternal Grandmother      Current Outpatient Medications:  .  lisinopril-hydrochlorothiazide (PRINZIDE,ZESTORETIC) 20-12.5 MG tablet, Take 1 tablet by mouth daily., Disp: , Rfl:  .  phentermine 37.5 MG capsule, Take 1 capsule (37.5 mg total) by mouth every morning. (Patient not taking: Reported on 07/05/2019), Disp: 30 capsule, Rfl: 0 .  Vitamin D, Ergocalciferol, (DRISDOL) 1.25 MG (50000 UT) CAPS capsule, One twice a week (Patient not taking: Reported on 07/05/2019), Disp: 8 capsule, Rfl: 1   No Known Allergies   Review of Systems  Constitutional: Negative.   HENT: Negative.   Eyes: Negative.   Respiratory: Negative.   Cardiovascular: Negative.  Negative for chest pain, palpitations and leg swelling.  Gastrointestinal: Negative.   Endocrine: Negative.   Genitourinary: Negative.   Musculoskeletal: Negative.   Skin: Negative.   Allergic/Immunologic: Negative.   Neurological: Negative.   Hematological: Negative.   Psychiatric/Behavioral: Negative.      Today's Vitals   07/05/19 1501  BP: 112/80  Pulse: 64  Temp: 98.7 F (37.1 C)  TempSrc: Oral  Weight: 235 lb 3.2 oz (106.7 kg)  Height: 5' 8.8" (1.748 m)  PainSc: 0-No pain   Body mass index  is 34.94 kg/m.   Objective:  Physical Exam Constitutional:      General: She is not in acute distress.    Appearance: Normal appearance. She is well-developed. She is obese.  HENT:     Head: Normocephalic and atraumatic.     Right Ear: Hearing, tympanic membrane, ear canal and external ear normal.     Left Ear: Hearing, tympanic membrane, ear canal and external ear normal.  Eyes:     General: Lids are normal.     Extraocular Movements: Extraocular movements intact.     Pupils: Pupils are equal, round, and reactive to light.      Funduscopic exam:    Right eye: No papilledema.        Left eye: No papilledema.  Neck:     Musculoskeletal: Full passive range of motion without pain, normal range of motion and neck supple.     Thyroid: No thyroid mass.     Vascular: No carotid bruit.  Cardiovascular:     Rate and Rhythm: Normal rate and regular rhythm.     Pulses: Normal pulses.     Heart sounds: Normal heart sounds. No murmur.  Pulmonary:     Effort: Pulmonary effort is normal. No respiratory distress.     Breath sounds: Normal breath sounds. No wheezing.  Abdominal:     General: Abdomen is flat. Bowel sounds are normal.     Palpations: Abdomen is soft.  Musculoskeletal: Normal range of motion.        General: No swelling.     Right lower leg: No edema.     Left lower leg: No edema.  Skin:    General: Skin is warm and dry.     Capillary Refill: Capillary refill takes less than 2 seconds.  Neurological:     General: No focal deficit present.     Mental Status: She is alert and oriented to person, place, and time.     Cranial Nerves: No cranial nerve deficit.     Sensory: No sensory deficit.  Psychiatric:        Mood and Affect: Mood normal.        Behavior: Behavior normal.        Thought Content: Thought content normal.        Judgment: Judgment normal.         Assessment And Plan:     1. Health maintenance examination . Behavior modifications discussed and diet history reviewed.   . Pt will continue to exercise regularly and modify diet with low GI, plant based foods and decrease intake of processed foods.  . Recommend intake of daily multivitamin, Vitamin D, and calcium.  . Recommend mammogram(up to date) for preventive screenings, as well as recommend immunizations that include influenza, TDAP  2. Essential hypertension, benign . B/P is controlled.  . CMP ordered to check renal function.  . The importance of regular exercise and dietary modification was stressed to the patient.   . Stressed importance of losing ten percent of her body weight to help with B/P control.  . The weight loss would help with decreasing cardiac and cancer risk as well.  . EKG done - POCT Urinalysis Dipstick (81002) - POCT UA - Microalbumin - EKG 12-Lead - Flu Vaccine QUAD 6+ mos PF IM (Fluarix Quad PF)  3. Encounter for screening  - CMP14 + Anion Gap - HIV antibody (with reflex)  4. Vitamin D deficiency disease  Will check vitamin D level and supplement as  needed.     Also encouraged to spend 15 minutes in the sun daily.  - VITAMIN D 25 Hydroxy (Vit-D Deficiency, Fractures)  5. Prediabetes  Chronic, controlled  No current medications - Lipid panel - Hemoglobin A1c  6. Class 1 obesity due to excess calories without serious comorbidity with body mass index (BMI) of 34.0 to 34.9 in adult  Will try her on contrave no longer taking phenermine  Encouraged to lose 1-2 lbs per week - Naltrexone-buPROPion HCl ER (CONTRAVE) 8-90 MG TB12; Start 1 tablet every morning for 7 days, then 1 tablet twice daily for 7 days, then 2 tablets every morning and one every evening  Dispense: 120 tablet; Refill: 1 - Lipid panel     Minette Brine, FNP    THE PATIENT IS ENCOURAGED TO PRACTICE SOCIAL DISTANCING DUE TO THE COVID-19 PANDEMIC.

## 2019-07-06 LAB — HEMOGLOBIN A1C
Est. average glucose Bld gHb Est-mCnc: 120 mg/dL
Hgb A1c MFr Bld: 5.8 % — ABNORMAL HIGH (ref 4.8–5.6)

## 2019-07-06 LAB — CMP14 + ANION GAP
ALT: 11 IU/L (ref 0–32)
AST: 12 IU/L (ref 0–40)
Albumin/Globulin Ratio: 1.6 (ref 1.2–2.2)
Albumin: 4.5 g/dL (ref 3.8–4.8)
Alkaline Phosphatase: 73 IU/L (ref 39–117)
Anion Gap: 13 mmol/L (ref 10.0–18.0)
BUN/Creatinine Ratio: 10 (ref 9–23)
BUN: 9 mg/dL (ref 6–24)
Bilirubin Total: 0.2 mg/dL (ref 0.0–1.2)
CO2: 25 mmol/L (ref 20–29)
Calcium: 10 mg/dL (ref 8.7–10.2)
Chloride: 101 mmol/L (ref 96–106)
Creatinine, Ser: 0.93 mg/dL (ref 0.57–1.00)
GFR calc Af Amer: 85 mL/min/{1.73_m2} (ref >59)
GFR calc non Af Amer: 74 mL/min/{1.73_m2} (ref >59)
Globulin, Total: 2.9 g/dL (ref 1.5–4.5)
Glucose: 93 mg/dL (ref 65–99)
Potassium: 4.1 mmol/L (ref 3.5–5.2)
Sodium: 139 mmol/L (ref 134–144)
Total Protein: 7.4 g/dL (ref 6.0–8.5)

## 2019-07-06 LAB — LIPID PANEL
Chol/HDL Ratio: 3 ratio (ref 0.0–4.4)
Cholesterol, Total: 224 mg/dL — ABNORMAL HIGH (ref 100–199)
HDL: 75 mg/dL (ref >39)
LDL Chol Calc (NIH): 133 mg/dL — ABNORMAL HIGH (ref 0–99)
Triglycerides: 93 mg/dL (ref 0–149)
VLDL Cholesterol Cal: 16 mg/dL (ref 5–40)

## 2019-07-06 LAB — HIV ANTIBODY (ROUTINE TESTING W REFLEX): HIV Screen 4th Generation wRfx: NONREACTIVE

## 2019-07-06 LAB — VITAMIN D 25 HYDROXY (VIT D DEFICIENCY, FRACTURES): Vit D, 25-Hydroxy: 22.2 ng/mL — ABNORMAL LOW (ref 30.0–100.0)

## 2019-07-11 ENCOUNTER — Other Ambulatory Visit: Payer: Self-pay | Admitting: Nurse Practitioner

## 2019-07-11 DIAGNOSIS — E559 Vitamin D deficiency, unspecified: Secondary | ICD-10-CM

## 2019-07-11 MED ORDER — VITAMIN D (ERGOCALCIFEROL) 1.25 MG (50000 UNIT) PO CAPS: ORAL_CAPSULE | ORAL | 1 refills | Status: DC

## 2019-07-26 ENCOUNTER — Other Ambulatory Visit: Payer: Self-pay

## 2019-07-26 MED ORDER — SAXENDA 18 MG/3ML ~~LOC~~ SOPN: 3.0000 mg | PEN_INJECTOR | Freq: Every day | SUBCUTANEOUS | 1 refills | Status: DC

## 2019-07-30 ENCOUNTER — Telehealth: Payer: Self-pay

## 2019-07-30 NOTE — Telephone Encounter (Signed)
I have submitted a PA for Saxenda through covermymeds we are now waiting on the determination from her insurance. YRL,RMA

## 2019-07-31 ENCOUNTER — Telehealth: Payer: Self-pay

## 2019-07-31 NOTE — Telephone Encounter (Signed)
Patient's PA for saxenda has been approved I have called pharmacy and made them aware they will contact pt. YRL,RMA

## 2019-08-11 ENCOUNTER — Other Ambulatory Visit: Payer: Self-pay | Admitting: Nurse Practitioner

## 2019-08-11 DIAGNOSIS — E559 Vitamin D deficiency, unspecified: Secondary | ICD-10-CM

## 2019-09-03 ENCOUNTER — Encounter: Payer: Self-pay | Admitting: Nurse Practitioner

## 2019-09-03 ENCOUNTER — Ambulatory Visit: Payer: BC Managed Care – PPO | Admitting: Nurse Practitioner

## 2019-09-03 ENCOUNTER — Other Ambulatory Visit: Payer: Self-pay

## 2019-09-03 VITALS — BP 112/68 | HR 65 | Temp 98.3°F | Ht 68.8 in | Wt 235.0 lb

## 2019-09-03 DIAGNOSIS — E6609 Other obesity due to excess calories: Secondary | ICD-10-CM

## 2019-09-03 DIAGNOSIS — Z6834 Body mass index (BMI) 34.0-34.9, adult: Secondary | ICD-10-CM | POA: Diagnosis not present

## 2019-09-03 NOTE — Progress Notes (Signed)
This visit occurred during the SARS-CoV-2 public health emergency.  Safety protocols were in place, including screening questions prior to the visit, additional usage of staff PPE, and extensive cleaning of exam room while observing appropriate contact time as indicated for disinfecting solutions.  Subjective:     Patient ID: Jody [name] , female    DOB: October 13, 1970 , 48 y.o.   MRN: [id number]   Chief Complaint  Patient presents with  . Weight Check    HPI  Here for weight check  Wt Readings from Last 3 Encounters: 09/03/19 : 235 lb (106.6 kg) 07/05/19 : 235 lb 3.2 oz (106.7 kg) 11/02/18 : 234 lb 12.8 oz (106.5 kg)    She is taking every day currently at 1.8 mg started again in November 2020, she is exercising once a week.  She was approved for Saxenda.  She is not eating as much.  She was at 228 lbs on Saturday, weighs in the morning before she gets in the shower.  This morning at home was 232 lbs.  She ate foods high in salt over the weekend at a Christmas party.  She has only had two bottles of water.       Past Medical History:  Diagnosis Date  . H/O amenorrhea   . History of chicken pox   . Hx of mumps   . Hyperlipidemia   . Hypertension   . Low iron   . Prediabetes   . Yeast infection      Family History  Problem Relation Age of Onset  . Hypertension Mother   . Diabetes Mother   . Hypertension Maternal Grandmother      Current Outpatient Medications:  .  Liraglutide -Weight Management (SAXENDA) 18 MG/3ML SOPN, Inject 3 mg into the skin daily., Disp: 5 pen, Rfl: 1 .  lisinopril-hydrochlorothiazide (PRINZIDE,ZESTORETIC) 20-12.5 MG tablet, Take 1 tablet by mouth daily., Disp: , Rfl:  .  Vitamin D, Ergocalciferol, (DRISDOL) 1.25 MG (50000 UT) CAPS capsule, TAKE ONE CAPSULE TWICE A WEEK, Disp: 8 capsule, Rfl: 1   No Known Allergies   Review of Systems  Constitutional: Negative.   Respiratory: Negative.   Cardiovascular: Negative.   Neurological:  Negative for dizziness and headaches.     Today's Vitals   09/03/19 1622  BP: 112/68  Pulse: 65  Temp: 98.3 F (36.8 C)  TempSrc: Oral  Weight: 235 lb (106.6 kg)  Height: 5' 8.8" (1.748 m)  PainSc: 0-No pain   Body mass index is 34.91 kg/m.   Objective:  Physical Exam Constitutional:      General: She is not in acute distress.    Appearance: Normal appearance. She is obese.  Cardiovascular:     Rate and Rhythm: Normal rate and regular rhythm.     Pulses: Normal pulses.     Heart sounds: Normal heart sounds. No murmur.  Skin:    Capillary Refill: Capillary refill takes less than 2 seconds.  Neurological:     General: No focal deficit present.     Mental Status: She is alert and oriented to person, place, and time.  Psychiatric:        Mood and Affect: Mood normal.        Behavior: Behavior normal.        Thought Content: Thought content normal.        Judgment: Judgment normal.         Assessment And Plan:     1. Class 1 obesity due to excess  calories without serious comorbidity with body mass index (BMI) of 34.0 to 34.9 in adult  Continue with Saxenda   Amp up her exercise to at least 4 days a week for 45 minutes each day  Continue good water intake    Arnette Felts, FNP    THE PATIENT IS ENCOURAGED TO PRACTICE SOCIAL DISTANCING DUE TO THE COVID-19 PANDEMIC.

## 2019-09-04 ENCOUNTER — Ambulatory Visit: Payer: BC Managed Care – PPO | Admitting: Nurse Practitioner

## 2019-09-10 ENCOUNTER — Other Ambulatory Visit: Payer: Self-pay | Admitting: Nurse Practitioner

## 2019-09-10 DIAGNOSIS — E559 Vitamin D deficiency, unspecified: Secondary | ICD-10-CM

## 2019-09-29 ENCOUNTER — Other Ambulatory Visit: Payer: Self-pay | Admitting: Nurse Practitioner

## 2019-10-08 ENCOUNTER — Other Ambulatory Visit: Payer: Self-pay | Admitting: Nurse Practitioner

## 2019-10-08 DIAGNOSIS — E559 Vitamin D deficiency, unspecified: Secondary | ICD-10-CM

## 2019-10-19 ENCOUNTER — Ambulatory Visit: Payer: BC Managed Care – PPO | Attending: Internal Medicine

## 2019-10-19 DIAGNOSIS — Z20822 Contact with and (suspected) exposure to covid-19: Secondary | ICD-10-CM

## 2019-10-21 LAB — NOVEL CORONAVIRUS, NAA: SARS-CoV-2, NAA: NOT DETECTED

## 2019-11-07 ENCOUNTER — Ambulatory Visit: Payer: BC Managed Care – PPO | Admitting: Internal Medicine

## 2019-11-10 ENCOUNTER — Other Ambulatory Visit: Payer: Self-pay | Admitting: Nurse Practitioner

## 2019-11-10 DIAGNOSIS — E559 Vitamin D deficiency, unspecified: Secondary | ICD-10-CM

## 2019-11-26 ENCOUNTER — Other Ambulatory Visit: Payer: Self-pay | Admitting: Nurse Practitioner

## 2019-11-26 DIAGNOSIS — E559 Vitamin D deficiency, unspecified: Secondary | ICD-10-CM

## 2019-11-28 ENCOUNTER — Other Ambulatory Visit: Payer: Self-pay | Admitting: Nurse Practitioner

## 2019-12-05 ENCOUNTER — Other Ambulatory Visit: Payer: Self-pay

## 2019-12-05 ENCOUNTER — Ambulatory Visit: Payer: BC Managed Care – PPO | Admitting: Internal Medicine

## 2019-12-05 ENCOUNTER — Encounter: Payer: Self-pay | Admitting: Internal Medicine

## 2019-12-05 ENCOUNTER — Telehealth: Payer: Self-pay

## 2019-12-05 VITALS — BP 130/88 | HR 87 | Temp 98.7°F | Ht 68.0 in | Wt 231.6 lb

## 2019-12-05 DIAGNOSIS — I10 Essential (primary) hypertension: Secondary | ICD-10-CM | POA: Diagnosis not present

## 2019-12-05 DIAGNOSIS — R7309 Other abnormal glucose: Secondary | ICD-10-CM | POA: Diagnosis not present

## 2019-12-05 DIAGNOSIS — Z6835 Body mass index (BMI) 35.0-35.9, adult: Secondary | ICD-10-CM

## 2019-12-05 MED ORDER — LISINOPRIL-HYDROCHLOROTHIAZIDE 20-12.5 MG PO TABS: 1.0000 | ORAL_TABLET | Freq: Every day | ORAL | 2 refills | Status: DC

## 2019-12-05 MED ORDER — SAXENDA 18 MG/3ML ~~LOC~~ SOPN: 3.0000 mg | PEN_INJECTOR | Freq: Every day | SUBCUTANEOUS | 1 refills | Status: DC

## 2019-12-05 NOTE — Patient Instructions (Signed)

## 2019-12-05 NOTE — Progress Notes (Signed)
This visit occurred during the SARS-CoV-2 public health emergency.  Safety protocols were in place, including screening questions prior to the visit, additional usage of staff PPE, and extensive cleaning of exam room while observing appropriate contact time as indicated for disinfecting solutions.  Subjective:     Patient ID: Jody [name] , female    DOB: August 13, 1971 , 49 y.o.   MRN: [id number]   Chief Complaint  Patient presents with  . Hypertension  . Obesity    HPI  She presents for BP check today, along with weight check. She admits that she has not been exercising; however, she and her husband recently purchased a [employer] bike. She plans to increase her activity level in the near future.   Hypertension This is a chronic problem. The current episode started more than 1 year ago. The problem has been gradually improving since onset. The problem is controlled. Pertinent negatives include no blurred vision, chest pain, palpitations or shortness of breath. Risk factors for coronary artery disease include obesity and sedentary lifestyle. The current treatment provides moderate improvement. Compliance problems include exercise.      Past Medical History:  Diagnosis Date  . H/O amenorrhea   . History of chicken pox   . Hx of mumps   . Hyperlipidemia   . Hypertension   . Low iron   . Prediabetes   . Yeast infection      Family History  Problem Relation Age of Onset  . Hypertension Mother   . Diabetes Mother   . Hypertension Maternal Grandmother      Current Outpatient Medications:  .  Liraglutide -Weight Management (SAXENDA) 18 MG/3ML SOPN, Inject 0.5 mLs (3 mg total) into the skin daily., Disp: 5 pen, Rfl: 1 .  lisinopril-hydrochlorothiazide (ZESTORETIC) 20-12.5 MG tablet, Take 1 tablet by mouth daily., Disp: 90 tablet, Rfl: 2 .  Vitamin D, Ergocalciferol, (DRISDOL) 1.25 MG (50000 UNIT) CAPS capsule, TAKE ONE CAPSULE TWICE A WEEK, Disp: 24 capsule, Rfl: 0   No Known  Allergies   Review of Systems  Constitutional: Negative.   Eyes: Negative for blurred vision.  Respiratory: Negative.  Negative for shortness of breath.   Cardiovascular: Negative.  Negative for chest pain and palpitations.  Gastrointestinal: Negative.   Neurological: Negative.   Psychiatric/Behavioral: Negative.      Today's Vitals   12/05/19 1418  BP: 130/88  Pulse: 87  Temp: 98.7 F (37.1 C)  Weight: 231 lb 9.6 oz (105.1 kg)  Height: '5\' 8"'$  (1.727 m)   Body mass index is 35.21 kg/m.   Wt Readings from Last 3 Encounters:  12/05/19 231 lb 9.6 oz (105.1 kg)  09/03/19 235 lb (106.6 kg)  07/05/19 235 lb 3.2 oz (106.7 kg)     Objective:  Physical Exam Vitals and nursing note reviewed.  Constitutional:      Appearance: Normal appearance. She is obese.  HENT:     Head: Normocephalic and atraumatic.  Cardiovascular:     Rate and Rhythm: Normal rate and regular rhythm.     Heart sounds: Normal heart sounds.  Pulmonary:     Effort: Pulmonary effort is normal.     Breath sounds: Normal breath sounds.  Skin:    General: Skin is warm.  Neurological:     General: No focal deficit present.     Mental Status: She is alert.  Psychiatric:        Mood and Affect: Mood normal.        Behavior: Behavior normal.  Assessment And Plan:     1. Essential hypertension, benign  Chronic, fair control. She will continue with current meds. She is encouraged to avoid adding salt to her foods. I will check renal function today.   - BMP8+EGFR  2. Other abnormal glucose  HER A1C HAS BEEN ELEVATED IN THE PAST. I WILL CHECK AN A1C, BMET TODAY. SHE WAS ENCOURAGED TO AVOID SUGARY BEVERAGES AND PROCESSED FOODS INCLUDNG BREADS, RICE AND PASTA.  - Hemoglobin A1c  3. Class 2 severe obesity due to excess calories with serious comorbidity and body mass index (BMI) of 35.0 to 35.9 in adult [facility])  She was congratulated on her 4 pound weight loss since her last visit. She was  encouraged to exercise no less than 150 minutes per week. Also encouraged to strive to lose at least 6-10 pounds prior to her next visit. She was given refill of Saxenda.   Maximino Greenland, MD    THE PATIENT IS ENCOURAGED TO PRACTICE SOCIAL DISTANCING DUE TO THE COVID-19 PANDEMIC.

## 2019-12-05 NOTE — Telephone Encounter (Signed)
PA started for patients Saxenda 18mg /10ml Pen Injectiors. Awaiting Determination.  Key : [id number]

## 2019-12-06 ENCOUNTER — Telehealth: Payer: Self-pay

## 2019-12-06 LAB — BMP8+EGFR
BUN/Creatinine Ratio: 13 (ref 9–23)
BUN: 11 mg/dL (ref 6–24)
CO2: 23 mmol/L (ref 20–29)
Calcium: 9.8 mg/dL (ref 8.7–10.2)
Chloride: 105 mmol/L (ref 96–106)
Creatinine, Ser: 0.88 mg/dL (ref 0.57–1.00)
GFR calc Af Amer: 90 mL/min/{1.73_m2} (ref >59)
GFR calc non Af Amer: 78 mL/min/{1.73_m2} (ref >59)
Glucose: 83 mg/dL (ref 65–99)
Potassium: 4.8 mmol/L (ref 3.5–5.2)
Sodium: 142 mmol/L (ref 134–144)

## 2019-12-06 LAB — HEMOGLOBIN A1C
Est. average glucose Bld gHb Est-mCnc: 114 mg/dL
Hgb A1c MFr Bld: 5.6 % (ref 4.8–5.6)

## 2019-12-06 NOTE — Telephone Encounter (Signed)
Called pt to let her know that her saxenda was denied. Pt wants to find something else for weight loss. Provider notified.

## 2019-12-08 LAB — SPECIMEN STATUS REPORT

## 2019-12-08 LAB — INSULIN, RANDOM: INSULIN: 28.4 u[IU]/mL — ABNORMAL HIGH (ref 2.6–24.9)

## 2020-02-12 ENCOUNTER — Encounter: Payer: Self-pay | Admitting: Internal Medicine

## 2020-02-12 ENCOUNTER — Other Ambulatory Visit: Payer: Self-pay

## 2020-02-12 ENCOUNTER — Ambulatory Visit: Payer: BC Managed Care – PPO | Admitting: Internal Medicine

## 2020-02-12 ENCOUNTER — Other Ambulatory Visit: Payer: Self-pay | Admitting: Nurse Practitioner

## 2020-02-12 VITALS — BP 114/76 | HR 83 | Temp 98.7°F | Ht 68.0 in | Wt 231.0 lb

## 2020-02-12 DIAGNOSIS — Z6835 Body mass index (BMI) 35.0-35.9, adult: Secondary | ICD-10-CM

## 2020-02-12 DIAGNOSIS — E66812 Obesity, class 2: Secondary | ICD-10-CM

## 2020-02-12 DIAGNOSIS — E8881 Metabolic syndrome: Secondary | ICD-10-CM

## 2020-02-12 DIAGNOSIS — I10 Essential (primary) hypertension: Secondary | ICD-10-CM

## 2020-02-12 DIAGNOSIS — E559 Vitamin D deficiency, unspecified: Secondary | ICD-10-CM

## 2020-02-12 DIAGNOSIS — E88819 Insulin resistance, unspecified: Secondary | ICD-10-CM

## 2020-02-12 NOTE — Patient Instructions (Signed)

## 2020-02-12 NOTE — Progress Notes (Signed)
This visit occurred during the SARS-CoV-2 public health emergency.  Safety protocols were in place, including screening questions prior to the visit, additional usage of staff PPE, and extensive cleaning of exam room while observing appropriate contact time as indicated for disinfecting solutions.  Subjective:     Patient ID: Jody [name] , female    DOB: 08/28/1971 , 49 y.o.   MRN: [id number]   Chief Complaint  Patient presents with  . Hypertension  . Obesity    HPI  She presents today for BP check. She reports compliance with meds. She is aware that her insurance will no longer cover Saxenda due to lack of results. She admits that she was not as committed as she should be with her exercise program.   Hypertension This is a recurrent problem. The current episode started more than 1 year ago. The problem is unchanged. The problem is controlled. Pertinent negatives include no chest pain, headaches, orthopnea or palpitations. The current treatment provides moderate improvement. Compliance problems include exercise.      Past Medical History:  Diagnosis Date  . H/O amenorrhea   . History of chicken pox   . Hx of mumps   . Hyperlipidemia   . Hypertension   . Low iron   . Prediabetes   . Yeast infection      Family History  Problem Relation Age of Onset  . Hypertension Mother   . Diabetes Mother   . Hypertension Maternal Grandmother      Current Outpatient Medications:  .  lisinopril-hydrochlorothiazide (ZESTORETIC) 20-12.5 MG tablet, Take 1 tablet by mouth daily., Disp: 90 tablet, Rfl: 2 .  Vitamin D, Ergocalciferol, (DRISDOL) 1.25 MG (50000 UNIT) CAPS capsule, TAKE ONE CAPSULE TWICE A WEEK, Disp: 24 capsule, Rfl: 0   No Known Allergies   Review of Systems  Constitutional: Negative.   Respiratory: Negative.   Cardiovascular: Negative.  Negative for chest pain, palpitations and orthopnea.  Gastrointestinal: Negative.   Neurological: Negative.  Negative for  headaches.  Psychiatric/Behavioral: Negative.      Today's Vitals   02/12/20 1100  BP: 114/76  Pulse: 83  Temp: 98.7 F (37.1 C)  TempSrc: Oral  Weight: 231 lb (104.8 kg)  Height: 5\' 8"  (1.727 m)   Body mass index is 35.12 kg/m.   Objective:  Physical Exam Vitals and nursing note reviewed.  Constitutional:      Appearance: Normal appearance. She is obese.  HENT:     Head: Normocephalic and atraumatic.  Cardiovascular:     Rate and Rhythm: Normal rate and regular rhythm.     Heart sounds: Normal heart sounds.  Pulmonary:     Effort: Pulmonary effort is normal.     Breath sounds: Normal breath sounds.  Skin:    General: Skin is warm.  Neurological:     General: No focal deficit present.     Mental Status: She is alert.  Psychiatric:        Mood and Affect: Mood normal.        Behavior: Behavior normal.         Assessment And Plan:     1. Essential hypertension, benign  Chronic, well controlled. She will continue with current meds. She is encouraged to avoid adding salt to her foods. Recent BMP results reviewed during her visit.  2. Insulin resistance  I will check insulin level today. We discussed the use of Rybelsus to address insulin resistance/obesity. She confirms no family history of thyroid cancer. Possible side  effects were reviewed in detail. I will make further recommendations once her labs are available for review.   - Insulin, random(561)  3. Class 2 severe obesity due to excess calories with serious comorbidity and body mass index (BMI) of 35.0 to 35.9 in adult [facility])  She is encouraged to strive for BMI less than 30 to decrease cardiac risk. She is advised to aim for at least 30 minutes of moderate exercise five days per week.    Gwynneth Aliment, MD    THE PATIENT IS ENCOURAGED TO PRACTICE SOCIAL DISTANCING DUE TO THE COVID-19 PANDEMIC.

## 2020-02-13 LAB — INSULIN, RANDOM: INSULIN: 20.4 u[IU]/mL (ref 2.6–24.9)

## 2020-03-11 ENCOUNTER — Ambulatory Visit: Payer: BC Managed Care – PPO | Admitting: Internal Medicine

## 2020-05-07 ENCOUNTER — Other Ambulatory Visit: Payer: BC Managed Care – PPO

## 2020-07-09 ENCOUNTER — Encounter: Payer: Self-pay | Admitting: Internal Medicine

## 2020-07-09 ENCOUNTER — Other Ambulatory Visit: Payer: Self-pay

## 2020-07-09 ENCOUNTER — Ambulatory Visit: Payer: BC Managed Care – PPO | Admitting: Nurse Practitioner

## 2020-07-09 ENCOUNTER — Encounter: Payer: Self-pay | Admitting: Nurse Practitioner

## 2020-07-09 VITALS — BP 122/80 | HR 82 | Temp 98.0°F | Ht 67.6 in | Wt 233.4 lb

## 2020-07-09 DIAGNOSIS — Z0000 Encounter for general adult medical examination without abnormal findings: Secondary | ICD-10-CM | POA: Diagnosis not present

## 2020-07-09 DIAGNOSIS — Z6835 Body mass index (BMI) 35.0-35.9, adult: Secondary | ICD-10-CM

## 2020-07-09 DIAGNOSIS — E559 Vitamin D deficiency, unspecified: Secondary | ICD-10-CM

## 2020-07-09 DIAGNOSIS — R5383 Other fatigue: Secondary | ICD-10-CM

## 2020-07-09 DIAGNOSIS — Z79899 Other long term (current) drug therapy: Secondary | ICD-10-CM

## 2020-07-09 DIAGNOSIS — I10 Essential (primary) hypertension: Secondary | ICD-10-CM | POA: Diagnosis not present

## 2020-07-09 DIAGNOSIS — Z23 Encounter for immunization: Secondary | ICD-10-CM

## 2020-07-09 DIAGNOSIS — Z1159 Encounter for screening for other viral diseases: Secondary | ICD-10-CM

## 2020-07-09 DIAGNOSIS — R7303 Prediabetes: Secondary | ICD-10-CM | POA: Diagnosis not present

## 2020-07-09 DIAGNOSIS — E66812 Obesity, class 2: Secondary | ICD-10-CM

## 2020-07-09 DIAGNOSIS — E7800 Pure hypercholesterolemia, unspecified: Secondary | ICD-10-CM

## 2020-07-09 LAB — POCT URINALYSIS DIPSTICK
Bilirubin, UA: NEGATIVE
Glucose, UA: NEGATIVE
Ketones, UA: NEGATIVE
Leukocytes, UA: NEGATIVE
Nitrite, UA: NEGATIVE
Protein, UA: NEGATIVE
Spec Grav, UA: 1.01 (ref 1.010–1.025)
Urobilinogen, UA: 0.2 E.U./dL
pH, UA: 6 (ref 5.0–8.0)

## 2020-07-09 LAB — POCT UA - MICROALBUMIN
Creatinine, POC: 50 mg/dL
Microalbumin Ur, POC: 30 mg/L

## 2020-07-09 MED ORDER — CYANOCOBALAMIN 1000 MCG/ML IJ SOLN
1000.0000 ug | Freq: Once | INTRAMUSCULAR | Status: AC
  Administered 2020-07-09: 1000 ug via INTRAMUSCULAR

## 2020-07-09 MED ORDER — WEGOVY 0.5 MG/0.5ML ~~LOC~~ SOAJ: 0.5000 mg | SUBCUTANEOUS | 0 refills | Status: DC

## 2020-07-09 NOTE — Progress Notes (Signed)
I,Yamilka Roman [employer] as a Education administrator for [employer], FNP.,have documented all relevant documentation on the behalf of Minette Brine, FNP,as directed by  Minette Brine, FNP while in the presence of Minette Brine, [place]. This visit occurred during the SARS-CoV-2 public health emergency.  Safety protocols were in place, including screening questions prior to the visit, additional usage of staff PPE, and extensive cleaning of exam room while observing appropriate contact time as indicated for disinfecting solutions.  Subjective:     Patient ID: Jody [name] , female    DOB: 1971/05/17 , 49 y.o.   MRN: [id number]   Chief Complaint  Patient presents with  . Annual Exam    HPI  Here for HM   Wt Readings from Last 3 Encounters: 07/09/20 : 233 lb 6.4 oz (105.9 kg) 02/12/20 : 231 lb (104.8 kg) 12/05/19 : 231 lb 9.6 oz (105.1 kg) She has taken [place] which did well.      Past Medical History:  Diagnosis Date  . H/O amenorrhea   . History of chicken pox   . Hx of mumps   . Hyperlipidemia   . Hypertension   . Low iron   . Prediabetes   . Yeast infection      Family History  Problem Relation Age of Onset  . Hypertension Mother   . Diabetes Mother   . Hypertension Maternal Grandmother      Current Outpatient Medications:  .  lisinopril-hydrochlorothiazide (ZESTORETIC) 20-12.5 MG tablet, Take 1 tablet by mouth daily., Disp: 90 tablet, Rfl: 2 .  Vitamin D, Ergocalciferol, (DRISDOL) 1.25 MG (50000 UNIT) CAPS capsule, TAKE ONE CAPSULE TWICE A WEEK, Disp: 24 capsule, Rfl: 0 .  Liraglutide -Weight Management (SAXENDA) 18 MG/3ML SOPN, Inject 3 mg into the skin daily., Disp: 15 mL, Rfl: 1 .  Semaglutide (RYBELSUS) 3 MG TABS, Take 3 mg by mouth daily at 2 am., Disp: 90 tablet, Rfl: 0   No Known Allergies    The patient states she uses tubal ligation for birth control. Last LMP was 07/06/2020, regular.  No LMP recorded.. Negative for Dysmenorrhea and Negative for Menorrhagia.  Mammogram 12/10/2019. Negative for: breast discharge, breast lump(s), breast pain and breast self exam. Associated symptoms include abnormal vaginal bleeding. Pertinent negatives include abnormal bleeding (hematology), anxiety, decreased libido, depression, difficulty falling sleep, dyspareunia, history of infertility, nocturia, sexual dysfunction, sleep disturbances, urinary incontinence, urinary urgency, vaginal discharge and vaginal itching. Diet regular. The patient states her exercise level is minimal.    The patient's tobacco use is:  Social History   Tobacco Use  Smoking Status Never Smoker  Smokeless Tobacco Never Used   She has been exposed to passive smoke. The patient's alcohol use is:  Social History   Substance and Sexual Activity  Alcohol Use No  . Alcohol/week: 1.0 standard drink  . Types: 1 Standard drinks or equivalent per week  . Additional information: Last pap 10/30/2018, next one scheduled for 10/30/2021   Review of Systems  Constitutional: Negative.   HENT: Negative.   Eyes: Negative.   Respiratory: Negative.   Cardiovascular: Negative.   Gastrointestinal: Negative.   Endocrine: Negative.   Genitourinary: Negative.   Musculoskeletal: Negative.   Skin: Negative.   Allergic/Immunologic: Negative.   Neurological: Negative.   Hematological: Negative.   Psychiatric/Behavioral: Negative.      Today's Vitals   07/09/20 1519  BP: 122/80  Pulse: 82  Temp: 98 F (36.7 C)  Weight: 233 lb 6.4 oz (105.9 kg)  Height: 5'  7.6" (1.717 m)  PainSc: 0-No pain   Body mass index is 35.91 kg/m.   Objective:  Physical Exam Constitutional:      General: She is not in acute distress.    Appearance: Normal appearance. She is well-developed. She is obese.  HENT:     Head: Normocephalic and atraumatic.     Right Ear: Hearing, tympanic membrane, ear canal and external ear normal. There is no impacted cerumen.     Left Ear: Hearing, tympanic membrane, ear canal and  external ear normal. There is no impacted cerumen.     Nose:     Comments: Deferred - masked    Mouth/Throat:     Comments: Deferred - masked Eyes:     General: Lids are normal.     Extraocular Movements: Extraocular movements intact.     Conjunctiva/sclera: Conjunctivae normal.     Pupils: Pupils are equal, round, and reactive to light.     Funduscopic exam:    Right eye: No papilledema.        Left eye: No papilledema.  Neck:     Thyroid: No thyroid mass.     Vascular: No carotid bruit.  Cardiovascular:     Rate and Rhythm: Normal rate and regular rhythm.     Pulses: Normal pulses.     Heart sounds: Normal heart sounds. No murmur heard.   Pulmonary:     Effort: Pulmonary effort is normal. No respiratory distress.     Breath sounds: Normal breath sounds. No wheezing.  Abdominal:     General: Abdomen is flat. Bowel sounds are normal.     Palpations: Abdomen is soft.  Musculoskeletal:        General: No swelling. Normal range of motion.     Cervical back: Full passive range of motion without pain, normal range of motion and neck supple.     Right lower leg: No edema.     Left lower leg: No edema.  Skin:    General: Skin is warm and dry.     Capillary Refill: Capillary refill takes less than 2 seconds.  Neurological:     General: No focal deficit present.     Mental Status: She is alert and oriented to person, place, and time.     Cranial Nerves: No cranial nerve deficit.     Sensory: No sensory deficit.  Psychiatric:        Mood and Affect: Mood normal.        Behavior: Behavior normal.        Thought Content: Thought content normal.        Judgment: Judgment normal.         Assessment And Plan:    1. Encounter for general adult medical examination w/o abnormal findings . Behavior modifications discussed and diet history reviewed.   . Pt will continue to exercise regularly and modify diet with low GI, plant based foods and decrease intake of processed foods.   . Recommend intake of daily multivitamin, Vitamin D, and calcium.  . Recommend mammogram (up to date)  for preventive screenings, as well as recommend immunizations that include influenza, TDAP  2. Encounter for hepatitis C screening test for low risk patient  Will check Hepatitis C screening due to recent recommendations to screen all adults 18 years and older - Hepatitis C antibody  3. Need for influenza vaccination  Influenza vaccine administered  Encouraged to take Tylenol as needed for fever or muscle aches. - Flu Vaccine QUAD  6+ mos PF IM (Fluarix Quad PF)  4. Essential hypertension, benign  Chronic, good control  EKG done with SR with right atrial enlargement   Encouraged to continue to have good blood pressure control - POCT Urinalysis Dipstick (81002) - EKG 12-Lead - CMP14+EGFR  5. Vitamin D deficiency disease  Will check vitamin D level and supplement as needed.     Also encouraged to spend 15 minutes in the sun daily.  - VITAMIN D 25 Hydroxy (Vit-D Deficiency, Fractures)  6. Prediabetes  Chronic, will also check insulin levels - POCT UA - Microalbumin - Hemoglobin A1c - Insulin, random  7. Other long term (current) drug therapy - CBC  8. Elevated cholesterol  Chronic, stable  Continue with low fat diet - Lipid panel  9. Class 2 severe obesity due to excess calories with serious comorbidity and body mass index (BMI) of 35.0 to 35.9 in adult [facility])  Chronic  Discussed healthy diet and regular exercise options   Encouraged to exercise at least 150 minutes per week with 2 days of strength training  10. Fatigue, unspecified type  - cyanocobalamin ((VITAMIN B-12)) injection 1,000 mcg     Patient was given opportunity to ask questions. Patient verbalized understanding of the plan and was able to repeat key elements of the plan. All questions were answered to their satisfaction.   Minette Brine, FNP   I, Minette Brine, FNP, have reviewed all  documentation for this visit. The documentation on 09/12/20 for the exam, diagnosis, procedures, and orders are all accurate and complete.  THE PATIENT IS ENCOURAGED TO PRACTICE SOCIAL DISTANCING DUE TO THE COVID-19 PANDEMIC.

## 2020-07-09 NOTE — Patient Instructions (Signed)
Health Maintenance, Female Adopting a healthy lifestyle and getting preventive care are important in promoting health and wellness. Ask your health care provider about:  The right schedule for you to have regular tests and exams.  Things you can do on your own to prevent diseases and keep yourself healthy. What should I know about diet, weight, and exercise? Eat a healthy diet   Eat a diet that includes plenty of vegetables, fruits, low-fat dairy products, and lean protein.  Do not eat a lot of foods that are high in solid fats, added sugars, or sodium. Maintain a healthy weight Body mass index (BMI) is used to identify weight problems. It estimates body fat based on height and weight. Your health care provider can help determine your BMI and help you achieve or maintain a healthy weight. Get regular exercise Get regular exercise. This is one of the most important things you can do for your health. Most adults should:  Exercise for at least 150 minutes each week. The exercise should increase your heart rate and make you sweat (moderate-intensity exercise).  Do strengthening exercises at least twice a week. This is in addition to the moderate-intensity exercise.  Spend less time sitting. Even light physical activity can be beneficial. Watch cholesterol and blood lipids Have your blood tested for lipids and cholesterol at 48 years of age, then have this test every 5 years. Have your cholesterol levels checked more often if:  Your lipid or cholesterol levels are high.  You are older than 48 years of age.  You are at high risk for heart disease. What should I know about cancer screening? Depending on your health history and family history, you may need to have cancer screening at various ages. This may include screening for:  Breast cancer.  Cervical cancer.  Colorectal cancer.  Skin cancer.  Lung cancer. What should I know about heart disease, diabetes, and high blood  pressure? Blood pressure and heart disease  High blood pressure causes heart disease and increases the risk of stroke. This is more likely to develop in people who have high blood pressure readings, are of African descent, or are overweight.  Have your blood pressure checked: ? Every 3-5 years if you are 18-39 years of age. ? Every year if you are 40 years old or older. Diabetes Have regular diabetes screenings. This checks your fasting blood sugar level. Have the screening done:  Once every three years after age 40 if you are at a normal weight and have a low risk for diabetes.  More often and at a younger age if you are overweight or have a high risk for diabetes. What should I know about preventing infection? Hepatitis B If you have a higher risk for hepatitis B, you should be screened for this virus. Talk with your health care provider to find out if you are at risk for hepatitis B infection. Hepatitis C Testing is recommended for:  Everyone born from 1945 through 1965.  Anyone with known risk factors for hepatitis C. Sexually transmitted infections (STIs)  Get screened for STIs, including gonorrhea and chlamydia, if: ? You are sexually active and are younger than 48 years of age. ? You are older than 48 years of age and your health care provider tells you that you are at risk for this type of infection. ? Your sexual activity has changed since you were last screened, and you are at increased risk for chlamydia or gonorrhea. Ask your health care provider if   you are at risk.  Ask your health care provider about whether you are at high risk for HIV. Your health care provider may recommend a prescription medicine to help prevent HIV infection. If you choose to take medicine to prevent HIV, you should first get tested for HIV. You should then be tested every 3 months for as long as you are taking the medicine. Pregnancy  If you are about to stop having your period (premenopausal) and  you may become pregnant, seek counseling before you get pregnant.  Take 400 to 800 micrograms (mcg) of folic acid every day if you become pregnant.  Ask for birth control (contraception) if you want to prevent pregnancy. Osteoporosis and menopause Osteoporosis is a disease in which the bones lose minerals and strength with aging. This can result in bone fractures. If you are 65 years old or older, or if you are at risk for osteoporosis and fractures, ask your health care provider if you should:  Be screened for bone loss.  Take a calcium or vitamin D supplement to lower your risk of fractures.  Be given hormone replacement therapy (HRT) to treat symptoms of menopause. Follow these instructions at home: Lifestyle  Do not use any products that contain nicotine or tobacco, such as cigarettes, e-cigarettes, and chewing tobacco. If you need help quitting, ask your health care provider.  Do not use street drugs.  Do not share needles.  Ask your health care provider for help if you need support or information about quitting drugs. Alcohol use  Do not drink alcohol if: ? Your health care provider tells you not to drink. ? You are pregnant, may be pregnant, or are planning to become pregnant.  If you drink alcohol: ? Limit how much you use to 0-1 drink a day. ? Limit intake if you are breastfeeding.  Be aware of how much alcohol is in your drink. In the [place] one drink equals one 12 oz bottle of beer (355 mL), one 5 oz glass of wine (148 mL), or one 1 oz glass of hard liquor (44 mL). General instructions  Schedule regular health, dental, and eye exams.  Stay current with your vaccines.  Tell your health care provider if: ? You often feel depressed. ? You have ever been abused or do not feel safe at home. Summary  Adopting a healthy lifestyle and getting preventive care are important in promoting health and wellness.  Follow your health care provider's instructions about healthy  diet, exercising, and getting tested or screened for diseases.  Follow your health care provider's instructions on monitoring your cholesterol and blood pressure. This information is not intended to replace advice given to you by your health care provider. Make sure you discuss any questions you have with your health care provider. Document Revised: 08/23/2018 Document Reviewed: 08/23/2018 Elsevier Patient Education  2020 Elsevier Inc.  

## 2020-07-10 LAB — CMP14+EGFR
ALT: 12 IU/L (ref 0–32)
AST: 12 IU/L (ref 0–40)
Albumin/Globulin Ratio: 1.4 (ref 1.2–2.2)
Albumin: 4.6 g/dL (ref 3.8–4.8)
Alkaline Phosphatase: 74 IU/L (ref 44–121)
BUN/Creatinine Ratio: 12 (ref 9–23)
BUN: 11 mg/dL (ref 6–24)
Bilirubin Total: <0.2 mg/dL (ref 0.0–1.2)
CO2: 24 mmol/L (ref 20–29)
Calcium: 9.6 mg/dL (ref 8.7–10.2)
Chloride: 101 mmol/L (ref 96–106)
Creatinine, Ser: 0.9 mg/dL (ref 0.57–1.00)
GFR calc Af Amer: 88 mL/min/{1.73_m2} (ref >59)
GFR calc non Af Amer: 76 mL/min/{1.73_m2} (ref >59)
Globulin, Total: 3.2 g/dL (ref 1.5–4.5)
Glucose: 78 mg/dL (ref 65–99)
Potassium: 4.1 mmol/L (ref 3.5–5.2)
Sodium: 138 mmol/L (ref 134–144)
Total Protein: 7.8 g/dL (ref 6.0–8.5)

## 2020-07-10 LAB — LIPID PANEL
Chol/HDL Ratio: 2.9 ratio (ref 0.0–4.4)
Cholesterol, Total: 226 mg/dL — ABNORMAL HIGH (ref 100–199)
HDL: 77 mg/dL (ref >39)
LDL Chol Calc (NIH): 124 mg/dL — ABNORMAL HIGH (ref 0–99)
Triglycerides: 142 mg/dL (ref 0–149)
VLDL Cholesterol Cal: 25 mg/dL (ref 5–40)

## 2020-07-10 LAB — CBC
Hematocrit: 38.2 % (ref 34.0–46.6)
Hemoglobin: 11.9 g/dL (ref 11.1–15.9)
MCH: 24.6 pg — ABNORMAL LOW (ref 26.6–33.0)
MCHC: 31.2 g/dL — ABNORMAL LOW (ref 31.5–35.7)
MCV: 79 fL (ref 79–97)
Platelets: 422 10*3/uL (ref 150–450)
RBC: 4.83 x10E6/uL (ref 3.77–5.28)
RDW: 13.6 % (ref 11.7–15.4)
WBC: 6.9 10*3/uL (ref 3.4–10.8)

## 2020-07-10 LAB — INSULIN, RANDOM: INSULIN: 27.5 u[IU]/mL — ABNORMAL HIGH (ref 2.6–24.9)

## 2020-07-10 LAB — VITAMIN D 25 HYDROXY (VIT D DEFICIENCY, FRACTURES): Vit D, 25-Hydroxy: 38.3 ng/mL (ref 30.0–100.0)

## 2020-07-10 LAB — HEPATITIS C ANTIBODY: Hep C Virus Ab: <0.1 s/co ratio (ref 0.0–0.9)

## 2020-07-10 LAB — HEMOGLOBIN A1C
Est. average glucose Bld gHb Est-mCnc: 126 mg/dL
Hgb A1c MFr Bld: 6 % — ABNORMAL HIGH (ref 4.8–5.6)

## 2020-07-14 ENCOUNTER — Encounter: Payer: Self-pay | Admitting: Nurse Practitioner

## 2020-07-15 ENCOUNTER — Other Ambulatory Visit: Payer: Self-pay | Admitting: Nurse Practitioner

## 2020-07-16 ENCOUNTER — Other Ambulatory Visit: Payer: Self-pay | Admitting: Nurse Practitioner

## 2020-07-16 DIAGNOSIS — Z6834 Body mass index (BMI) 34.0-34.9, adult: Secondary | ICD-10-CM

## 2020-07-16 MED ORDER — SAXENDA 18 MG/3ML ~~LOC~~ SOPN: 3.0000 mg | PEN_INJECTOR | Freq: Every day | SUBCUTANEOUS | 1 refills | Status: DC

## 2020-07-23 ENCOUNTER — Ambulatory Visit: Payer: BC Managed Care – PPO | Admitting: Internal Medicine

## 2020-07-25 ENCOUNTER — Other Ambulatory Visit: Payer: Self-pay

## 2020-07-25 MED ORDER — RYBELSUS 3 MG PO TABS: 3.0000 mg | ORAL_TABLET | Freq: Every day | ORAL | 0 refills | Status: DC

## 2020-08-21 ENCOUNTER — Ambulatory Visit: Payer: BC Managed Care – PPO | Admitting: Internal Medicine

## 2020-09-03 ENCOUNTER — Encounter: Payer: Self-pay | Admitting: Internal Medicine

## 2020-10-01 ENCOUNTER — Other Ambulatory Visit: Payer: Self-pay | Admitting: Internal Medicine

## 2020-10-20 ENCOUNTER — Ambulatory Visit: Payer: BC Managed Care – PPO | Admitting: Internal Medicine

## 2020-12-25 ENCOUNTER — Encounter: Payer: Self-pay | Admitting: Internal Medicine

## 2021-01-05 ENCOUNTER — Encounter: Payer: Self-pay | Admitting: Nurse Practitioner

## 2021-01-05 ENCOUNTER — Other Ambulatory Visit: Payer: Self-pay

## 2021-01-05 ENCOUNTER — Ambulatory Visit: Payer: BC Managed Care – PPO | Admitting: Nurse Practitioner

## 2021-01-05 VITALS — BP 116/72 | HR 76 | Temp 98.4°F | Ht 67.2 in | Wt 234.6 lb

## 2021-01-05 DIAGNOSIS — Z6836 Body mass index (BMI) 36.0-36.9, adult: Secondary | ICD-10-CM

## 2021-01-05 DIAGNOSIS — E6609 Other obesity due to excess calories: Secondary | ICD-10-CM | POA: Diagnosis not present

## 2021-01-05 DIAGNOSIS — I10 Essential (primary) hypertension: Secondary | ICD-10-CM | POA: Diagnosis not present

## 2021-01-05 DIAGNOSIS — R7303 Prediabetes: Secondary | ICD-10-CM | POA: Insufficient documentation

## 2021-01-05 NOTE — Patient Instructions (Signed)

## 2021-01-05 NOTE — Progress Notes (Signed)
I,Yamilka Roman [employer] as a Education administrator for [employer], FNP.,have documented all relevant documentation on the behalf of Minette Brine, FNP,as directed by  Minette Brine, FNP while in the presence of Minette Brine, [place]. This visit occurred during the SARS-CoV-2 public health emergency.  Safety protocols were in place, including screening questions prior to the visit, additional usage of staff PPE, and extensive cleaning of exam room while observing appropriate contact time as indicated for disinfecting solutions.  Subjective:     Patient ID: Jody [name] , female    DOB: [birth year] , 50 y.o.   MRN: [id number]   Chief Complaint  Patient presents with  . Hypertension  . discuss weight loss surgery    HPI  She presents today for BP check. She also wanted to discuss getting weight loss surgery.  She is not taking any medications for weight loss. She has been on Saxenda, phentermine without sufficient results. When she took the [place] her weight leveled off and her HgbA1c was improved. Her insurance would not cover the medications.   Wt Readings from Last 3 Encounters: 01/05/21 : 234 lb 9.6 oz (106.4 kg) 07/09/20 : 233 lb 6.4 oz (105.9 kg) 02/12/20 : 231 lb (104.8 kg)  She is exercising with a boflex bike 3 times a week - September. No strength training.   Hypertension This is a recurrent problem. The current episode started more than 1 year ago. The problem is unchanged. The problem is controlled. Pertinent negatives include no chest pain, headaches, orthopnea, palpitations or shortness of breath. There are no associated agents to hypertension. Risk factors for coronary artery disease include obesity and sedentary lifestyle. The current treatment provides moderate improvement. Compliance problems include exercise.      Past Medical History:  Diagnosis Date  . H/O amenorrhea   . History of chicken pox   . Hx of mumps   . Hyperlipidemia   . Hypertension   . Low iron   .  Prediabetes   . Yeast infection      Family History  Problem Relation Age of Onset  . Hypertension Mother   . Diabetes Mother   . Hypertension Maternal Grandmother      Current Outpatient Medications:  .  lisinopril-hydrochlorothiazide (ZESTORETIC) 20-12.5 MG tablet, TAKE 1 TABLET BY MOUTH EVERY DAY, Disp: 90 tablet, Rfl: 2   No Known Allergies   Review of Systems  Constitutional: Negative.   Respiratory: Negative.  Negative for shortness of breath and wheezing.   Cardiovascular: Negative for chest pain, palpitations, orthopnea and leg swelling.  Neurological: Negative for dizziness and headaches.  Psychiatric/Behavioral: Negative.      Today's Vitals   01/05/21 1016  BP: 116/72  Pulse: 76  Temp: 98.4 F (36.9 C)  TempSrc: Oral  Weight: 234 lb 9.6 oz (106.4 kg)  Height: 5' 7.2" (1.707 m)  PainSc: 0-No pain   Body mass index is 36.53 kg/m.   Objective:  Physical Exam Vitals and nursing note reviewed.  Constitutional:      General: She is not in acute distress.    Appearance: Normal appearance. She is obese.  Cardiovascular:     Rate and Rhythm: Normal rate and regular rhythm.     Pulses: Normal pulses.     Heart sounds: Normal heart sounds. No murmur heard.   Pulmonary:     Effort: Pulmonary effort is normal. No respiratory distress.     Breath sounds: Normal breath sounds. No wheezing.  Skin:    General: Skin is  warm.  Neurological:     General: No focal deficit present.     Mental Status: She is alert and oriented to person, place, and time.     Cranial Nerves: No cranial nerve deficit.  Psychiatric:        Mood and Affect: Mood normal.        Behavior: Behavior normal.        Thought Content: Thought content normal.        Judgment: Judgment normal.         Assessment And Plan:     1. Essential hypertension, benign  Chronic, blood pressure is well controlled  Continue with current medications   2. Prediabetes  Chronic, fair control last  HgbA1c was 6.0, I will start her on Ozempic 0.25 mg weekly  Encouraged to eat a healthy diet and if she is doing intermittent fasting to make sure she is not eating late and going to sleep within an hour of eating - Amb Ref to [facility]  3. Class 2 obesity due to excess calories with body mass index (BMI) of 36.0 to 36.9 in adult, unspecified whether serious comorbidity present  Chronic  Discussed healthy diet and regular exercise options   Encouraged to exercise at least 150 minutes per week with 2 days of strength training  I will refer her to the weight loss clinic as she has tried multiple medications to include Saxenda and phentermine with minimal results.  - BMP8+eGFR - Hemoglobin A1c     Patient was given opportunity to ask questions. Patient verbalized understanding of the plan and was able to repeat key elements of the plan. All questions were answered to their satisfaction.  Minette Brine, FNP   I, Minette Brine, FNP, have reviewed all documentation for this visit. The documentation on 01/05/21 for the exam, diagnosis, procedures, and orders are all accurate and complete.   IF YOU HAVE BEEN REFERRED TO A SPECIALIST, IT MAY TAKE 1-2 WEEKS TO SCHEDULE/PROCESS THE REFERRAL. IF YOU HAVE NOT HEARD FROM US/SPECIALIST IN TWO WEEKS, PLEASE GIVE [place] A CALL AT [phone].   THE PATIENT IS ENCOURAGED TO PRACTICE SOCIAL DISTANCING DUE TO THE COVID-19 PANDEMIC.

## 2021-01-06 LAB — BMP8+EGFR
BUN/Creatinine Ratio: 15 (ref 9–23)
BUN: 12 mg/dL (ref 6–24)
CO2: 22 mmol/L (ref 20–29)
Calcium: 10 mg/dL (ref 8.7–10.2)
Chloride: 102 mmol/L (ref 96–106)
Creatinine, Ser: 0.8 mg/dL (ref 0.57–1.00)
Glucose: 87 mg/dL (ref 65–99)
Potassium: 4.5 mmol/L (ref 3.5–5.2)
Sodium: 136 mmol/L (ref 134–144)
eGFR: 91 mL/min/{1.73_m2} (ref >59)

## 2021-01-06 LAB — HEMOGLOBIN A1C
Est. average glucose Bld gHb Est-mCnc: 128 mg/dL
Hgb A1c MFr Bld: 6.1 % — ABNORMAL HIGH (ref 4.8–5.6)

## 2021-03-03 ENCOUNTER — Ambulatory Visit (INDEPENDENT_AMBULATORY_CARE_PROVIDER_SITE_OTHER): Payer: BC Managed Care – PPO | Admitting: Family Medicine

## 2021-03-03 ENCOUNTER — Other Ambulatory Visit: Payer: Self-pay

## 2021-03-03 ENCOUNTER — Encounter (INDEPENDENT_AMBULATORY_CARE_PROVIDER_SITE_OTHER): Payer: Self-pay | Admitting: Family Medicine

## 2021-03-03 VITALS — BP 143/86 | HR 71 | Temp 98.1°F | Ht 69.0 in | Wt 233.0 lb

## 2021-03-03 DIAGNOSIS — R0683 Snoring: Secondary | ICD-10-CM

## 2021-03-03 DIAGNOSIS — Z6834 Body mass index (BMI) 34.0-34.9, adult: Secondary | ICD-10-CM

## 2021-03-03 DIAGNOSIS — E559 Vitamin D deficiency, unspecified: Secondary | ICD-10-CM | POA: Diagnosis not present

## 2021-03-03 DIAGNOSIS — I10 Essential (primary) hypertension: Secondary | ICD-10-CM

## 2021-03-03 DIAGNOSIS — R5383 Other fatigue: Secondary | ICD-10-CM

## 2021-03-03 DIAGNOSIS — R0602 Shortness of breath: Secondary | ICD-10-CM | POA: Diagnosis not present

## 2021-03-03 DIAGNOSIS — R718 Other abnormality of red blood cells: Secondary | ICD-10-CM

## 2021-03-03 DIAGNOSIS — R948 Abnormal results of function studies of other organs and systems: Secondary | ICD-10-CM

## 2021-03-03 DIAGNOSIS — F3289 Other specified depressive episodes: Secondary | ICD-10-CM

## 2021-03-03 DIAGNOSIS — Z9189 Other specified personal risk factors, not elsewhere classified: Secondary | ICD-10-CM

## 2021-03-03 DIAGNOSIS — R7301 Impaired fasting glucose: Secondary | ICD-10-CM | POA: Diagnosis not present

## 2021-03-03 DIAGNOSIS — Z0289 Encounter for other administrative examinations: Secondary | ICD-10-CM

## 2021-03-03 DIAGNOSIS — Z9851 Tubal ligation status: Secondary | ICD-10-CM

## 2021-03-03 DIAGNOSIS — E669 Obesity, unspecified: Secondary | ICD-10-CM

## 2021-03-03 MED ORDER — OZEMPIC (0.25 OR 0.5 MG/DOSE) 2 MG/1.5ML ~~LOC~~ SOPN: 0.2500 mg | PEN_INJECTOR | SUBCUTANEOUS | 0 refills | Status: DC

## 2021-03-03 NOTE — Progress Notes (Signed)
Dear Jody Felts, FNP,   Thank you for referring Jody [name] to our clinic. The following note includes my evaluation and treatment recommendations.  Chief Complaint:   OBESITY Jody [name] (MR# [id number]) is a 50 y.o. female who presents for evaluation and treatment of obesity and related comorbidities. Current BMI is Body mass index is 34.41 kg/m. Jody [name] has been struggling with her weight for many years and has been unsuccessful in either losing weight, maintaining weight loss, or reaching her healthy weight goal.  Jody [name] is currently in the action stage of change and ready to dedicate time achieving and maintaining a healthier weight. Jody [name] is interested in becoming our patient and working on intensive lifestyle modifications including (but not limited to) diet and exercise for weight loss.  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  She is married and lives with her spouse and 2 children, ages 57 and 68.  She craves food more at night.  Jody [name].  She has tried [place], Rybelsus, Victoza, and phentermine in the past.  Her eating window is from noon to 8 pm.  She has no set foods for lunch.  Jody [name]'s habits were reviewed today and are as follows: Her family eats meals together, she thinks her family will eat healthier with her, her desired weight loss is 50 pounds, she started gaining excessive weight in 2011, her heaviest weight ever was 240 pounds, she craves bread, sugars, and fried foods, she snacks frequently in the evenings, she Jody breakfast frequently, she is frequently drinking liquids with calories, she frequently makes poor food choices, she frequently eats larger portions than normal, and she struggles with emotional eating.  Depression Screen Jody [name]'s Food and Mood (modified PHQ-9) score was 11.  Depression screen [facility] 2/9 03/03/2021  Decreased Interest 1  Down, Depressed, Hopeless 0   PHQ - 2 Score 1  Altered sleeping 3  Tired, decreased energy 3  Change in appetite 3  Feeling bad or failure about yourself  0  Trouble concentrating 1  Moving slowly or fidgety/restless 0  Suicidal thoughts 0  PHQ-9 Score 11  Difficult doing work/chores Not difficult at all   Assessment/Plan:   1. Other fatigue Stana admits to daytime somnolence and reports waking up still tired. Patent has a history of symptoms of daytime fatigue, morning fatigue, and snoring. Arria generally gets a few hours of sleep per night, and states that she has poor sleep quality. Snoring is present. Apneic episodes are not present. Epworth Sleepiness Score is 15.  Jody [name] does feel that her weight is causing her energy to be lower than it should be. Fatigue may be related to obesity, depression or many other causes. Labs will be ordered, and in the meanwhile, Jody [name] will focus on self care including making healthy food choices, increasing physical activity and focusing on stress reduction.  - EKG 12-Lead - TSH - T4, free  2. SOB (shortness of breath) on exertion Jody [name] notes increasing shortness of breath with exercising and seems to be worsening over time with weight gain. She notes getting out of breath sooner with activity than she used to. This has gotten worse recently. Jody [name] denies shortness of breath at rest or orthopnea.  Jody [name] does feel that she gets out of breath more easily that she used to when she exercises. Noreta's shortness of breath appears to be obesity related and exercise induced. She has agreed to work on [employer]  loss and gradually increase exercise to treat her exercise induced shortness of breath. Will continue to monitor closely.  3. Essential hypertension Elevated today.  Medications: lisinopril-HCTZ 20-12.5 mg [name].   Plan: Avoid buying foods that are: processed, frozen, or prepackaged to avoid excess salt. We will watch for signs of hypotension as she continues  lifestyle modifications.  BP Readings from Last 3 Encounters:  03/03/21 (!) 143/86  01/05/21 116/72  07/09/20 122/80   Lab Results  Component Value Date   CREATININE 0.80 03/03/2021   - CBC with Differential/Platelet - Comprehensive metabolic panel - Lipid panel - TSH - T4, free  4. Impaired fasting glucose A1c was 6.1 on 01/05/2021.  Plan:  Will check labs today.  Start Ozempic 0.25 subcutaneously weekly.  - Start Semaglutide,0.25 or 0.5MG /DOS, (OZEMPIC, 0.25 OR 0.5 MG/DOSE,) 2 MG/1.5ML SOPN; Inject 0.25 mg into the skin once a week.  Dispense: 1.5 mL; Refill: 0  5. History of tubal ligation Jody [name] had tubal ligation in 2014.  6. Snores Jody [name] endorses snoring.  She says she does not get restful sleep.  Epworth sleepiness score is 15.  7. Vitamin D deficiency Not at goal. Current vitamin D is 38.3, tested on 07/09/2020. Optimal goal > 50 ng/dL.   Plan:  Will check vitamin D level today.  - VITAMIN D 25 Hydroxy (Vit-D Deficiency, Fractures)  8. Abnormal metabolism, slower Her metabolism is slower than normal.    Plan:  Check labs today, as per below.  - Lipid panel - TSH - T4, free  9. Microcytic red blood cells Check anemia panel and CBC today.  CBC Latest Ref Rng & Units 03/03/2021 07/09/2020 11/02/2018  WBC 3.4 - 10.8 x10E3/uL 6.0 6.9 5.9  Hemoglobin 11.1 - 15.9 g/dL 50.5 39.7 67.3  Hematocrit 34.0 - 46.6 % 36.5 38.2 37.3  Platelets 150 - 450 x10E3/uL 371 422 405   Lab Results  Component Value Date   IRON 44 03/03/2021   TIBC 350 03/03/2021   FERRITIN 52 03/03/2021   Lab Results  Component Value Date   VITAMINB12 393 03/03/2021   - Anemia panel - CBC with Differential/Platelet  10. Other depression, with emotional eating Not at goal. Medication: None.  Jody [name] eats when stressed, sad, as a reward, when bored, when feeling guilty, and for comfort.  She overeats [name].  There is evidence of BED.  Will discuss Vyvanse at a future visit.  Plan:  Behavior  modification techniques were discussed today to help deal with emotional/non-hunger eating behaviors.  11. At risk for heart disease Due to Jody [name]'s current state of health and medical condition(s), she is at a higher risk for heart disease.  This puts the patient at much greater risk to subsequently develop cardiopulmonary conditions that can significantly affect patient's quality of life in a negative manner.    At least 8 minutes were spent on counseling Jody [name] about these concerns today. Evidence-based interventions for health behavior change were utilized today including the discussion of self monitoring techniques, problem-solving barriers, and SMART goal setting techniques.  Specifically, regarding patient's less desirable eating habits and patterns, we employed the technique of small changes when Jody [name] has not been able to fully commit to her prudent nutritional plan.  12. Class 1 obesity with serious comorbidity and body mass index (BMI) of 34.0 to 34.9 in adult, unspecified obesity type  Sudie is currently in the action stage of change and her goal is to continue with weight loss efforts. I recommend Jody [name] begin the structured  treatment plan as follows:  She has agreed to the Category 2 Plan.  Exercise goals: No exercise has been prescribed at this time.   Behavioral modification strategies: increasing lean protein intake, decreasing simple carbohydrates, increasing vegetables, increasing water intake, decreasing liquid calories, decreasing sodium intake, and increasing high fiber foods.  She was informed of the importance of frequent follow-up visits to maximize her success with intensive lifestyle modifications for her multiple health conditions. She was informed we would discuss her lab results at her next visit unless there is a critical issue that needs to be addressed sooner. Jody [name] agreed to keep her next visit at the agreed upon time to discuss these  results.  Objective:   Blood pressure (!) 143/86, pulse 71, temperature 98.1 F (36.7 C), temperature source Oral, height 5\' 9"  (1.753 m), weight 233 lb (105.7 kg), last menstrual period 02/03/2021, SpO2 98 %. Body mass index is 34.41 kg/m.  EKG: Normal sinus rhythm, rate 70 bpm.  Indirect Calorimeter completed today shows a VO2 of 216 and a REE of 1506.  Her calculated basal metabolic rate is [id number] thus her basal metabolic rate is worse than expected.  General: Cooperative, alert, well developed, in no acute distress. HEENT: Conjunctivae and lids unremarkable. Cardiovascular: Regular rhythm.  Lungs: Normal work of breathing. Neurologic: No focal deficits.   Lab Results  Component Value Date   CREATININE 0.80 03/03/2021   BUN 12 03/03/2021   NA 141 03/03/2021   K 4.5 03/03/2021   CL 107 (H) 03/03/2021   CO2 21 03/03/2021   Lab Results  Component Value Date   ALT 13 03/03/2021   AST 10 03/03/2021   ALKPHOS 73 03/03/2021   BILITOT <0.2 03/03/2021   Lab Results  Component Value Date   HGBA1C 6.1 (H) 01/05/2021   HGBA1C 6.0 (H) 07/09/2020   HGBA1C 5.6 12/05/2019   HGBA1C 5.8 (H) 07/05/2019   HGBA1C 5.9 (H) 11/02/2018   Lab Results  Component Value Date   INSULIN 27.5 (H) 07/09/2020   INSULIN 20.4 02/12/2020   INSULIN 28.4 (H) 12/05/2019   Lab Results  Component Value Date   CHOL 213 (H) 03/03/2021   HDL 72 03/03/2021   LDLCALC 123 (H) 03/03/2021   TRIG 101 03/03/2021   CHOLHDL 3.0 03/03/2021   Lab Results  Component Value Date   WBC 6.0 03/03/2021   HGB 11.8 03/03/2021   HCT 36.5 03/03/2021   MCV 79 03/03/2021   PLT 371 03/03/2021   Attestation Statements:   This is the patient's first visit at [facility]. The patient's NEW PATIENT PACKET was reviewed at length. Included in the packet: current and past health history, medications, allergies, ROS, gynecologic history (women only), surgical history, family history, social history, weight  history, weight loss surgery history (for those that have had weight loss surgery), nutritional evaluation, mood and food questionnaire, PHQ9, Epworth questionnaire, sleep habits questionnaire, patient life and health improvement goals questionnaire. These will all be scanned into the patient's chart under media.   During the visit, I independently reviewed the patient's EKG, bioimpedance scale results, and indirect calorimeter results. I used this information to tailor a meal plan for the patient that will help her to lose weight and will improve her obesity-related conditions going forward. I performed a medically necessary appropriate examination and/or evaluation. I discussed the assessment and treatment plan with the patient. The patient was provided an opportunity to ask questions and all were answered. The patient agreed with the plan  and demonstrated an understanding of the instructions. Labs were ordered at this visit and will be reviewed at the next visit unless more critical results need to be addressed immediately. Clinical information was updated and documented in the EMR.   I, Insurance claims handler, CMA, am acting as transcriptionist for Helane Rima, DO  I have reviewed the above documentation for accuracy and completeness, and I agree with the above. Helane Rima, DO

## 2021-03-04 LAB — ANEMIA PANEL
Ferritin: 52 ng/mL (ref 15–150)
Folate, Hemolysate: 337 ng/mL
Folate, RBC: 923 ng/mL (ref >498)
Hematocrit: 36.5 % (ref 34.0–46.6)
Iron Saturation: 13 % — ABNORMAL LOW (ref 15–55)
Iron: 44 ug/dL (ref 27–159)
Retic Ct Pct: 1.5 % (ref 0.6–2.6)
Total Iron Binding Capacity: 350 ug/dL (ref 250–450)
UIBC: 306 ug/dL (ref 131–425)
Vitamin B-12: 393 pg/mL (ref 232–1245)

## 2021-03-04 LAB — LIPID PANEL
Chol/HDL Ratio: 3 ratio (ref 0.0–4.4)
Cholesterol, Total: 213 mg/dL — ABNORMAL HIGH (ref 100–199)
HDL: 72 mg/dL (ref >39)
LDL Chol Calc (NIH): 123 mg/dL — ABNORMAL HIGH (ref 0–99)
Triglycerides: 101 mg/dL (ref 0–149)
VLDL Cholesterol Cal: 18 mg/dL (ref 5–40)

## 2021-03-04 LAB — CBC WITH DIFFERENTIAL/PLATELET
Basophils Absolute: 0 10*3/uL (ref 0.0–0.2)
Basos: 1 %
EOS (ABSOLUTE): 0.1 10*3/uL (ref 0.0–0.4)
Eos: 1 %
Hemoglobin: 11.8 g/dL (ref 11.1–15.9)
Immature Grans (Abs): 0 10*3/uL (ref 0.0–0.1)
Immature Granulocytes: 0 %
Lymphocytes Absolute: 2.5 10*3/uL (ref 0.7–3.1)
Lymphs: 42 %
MCH: 25.5 pg — ABNORMAL LOW (ref 26.6–33.0)
MCHC: 32.3 g/dL (ref 31.5–35.7)
MCV: 79 fL (ref 79–97)
Monocytes Absolute: 0.4 10*3/uL (ref 0.1–0.9)
Monocytes: 6 %
Neutrophils Absolute: 3 10*3/uL (ref 1.4–7.0)
Neutrophils: 50 %
Platelets: 371 10*3/uL (ref 150–450)
RBC: 4.63 x10E6/uL (ref 3.77–5.28)
RDW: 13.6 % (ref 11.7–15.4)
WBC: 6 10*3/uL (ref 3.4–10.8)

## 2021-03-04 LAB — COMPREHENSIVE METABOLIC PANEL
ALT: 13 IU/L (ref 0–32)
AST: 10 IU/L (ref 0–40)
Albumin/Globulin Ratio: 1.3 (ref 1.2–2.2)
Albumin: 4.1 g/dL (ref 3.8–4.8)
Alkaline Phosphatase: 73 IU/L (ref 44–121)
BUN/Creatinine Ratio: 15 (ref 9–23)
BUN: 12 mg/dL (ref 6–24)
Bilirubin Total: <0.2 mg/dL (ref 0.0–1.2)
CO2: 21 mmol/L (ref 20–29)
Calcium: 9.1 mg/dL (ref 8.7–10.2)
Chloride: 107 mmol/L — ABNORMAL HIGH (ref 96–106)
Creatinine, Ser: 0.8 mg/dL (ref 0.57–1.00)
Globulin, Total: 3.1 g/dL (ref 1.5–4.5)
Glucose: 90 mg/dL (ref 65–99)
Potassium: 4.5 mmol/L (ref 3.5–5.2)
Sodium: 141 mmol/L (ref 134–144)
Total Protein: 7.2 g/dL (ref 6.0–8.5)
eGFR: 91 mL/min/{1.73_m2} (ref >59)

## 2021-03-04 LAB — TSH: TSH: 0.956 u[IU]/mL (ref 0.450–4.500)

## 2021-03-04 LAB — VITAMIN D 25 HYDROXY (VIT D DEFICIENCY, FRACTURES): Vit D, 25-Hydroxy: 14.9 ng/mL — ABNORMAL LOW (ref 30.0–100.0)

## 2021-03-04 LAB — T4, FREE: Free T4: 1.31 ng/dL (ref 0.82–1.77)

## 2021-03-17 ENCOUNTER — Ambulatory Visit (INDEPENDENT_AMBULATORY_CARE_PROVIDER_SITE_OTHER): Payer: BC Managed Care – PPO | Admitting: Family Medicine

## 2021-03-17 ENCOUNTER — Encounter (INDEPENDENT_AMBULATORY_CARE_PROVIDER_SITE_OTHER): Payer: Self-pay | Admitting: Family Medicine

## 2021-03-17 ENCOUNTER — Other Ambulatory Visit: Payer: Self-pay

## 2021-03-17 VITALS — BP 140/83 | HR 79 | Temp 98.5°F | Ht 69.0 in | Wt 234.0 lb

## 2021-03-17 DIAGNOSIS — Z6834 Body mass index (BMI) 34.0-34.9, adult: Secondary | ICD-10-CM

## 2021-03-17 DIAGNOSIS — F5081 Binge eating disorder: Secondary | ICD-10-CM | POA: Diagnosis not present

## 2021-03-17 DIAGNOSIS — E559 Vitamin D deficiency, unspecified: Secondary | ICD-10-CM

## 2021-03-17 DIAGNOSIS — Z9189 Other specified personal risk factors, not elsewhere classified: Secondary | ICD-10-CM | POA: Diagnosis not present

## 2021-03-17 DIAGNOSIS — E669 Obesity, unspecified: Secondary | ICD-10-CM

## 2021-03-17 DIAGNOSIS — R7301 Impaired fasting glucose: Secondary | ICD-10-CM | POA: Diagnosis not present

## 2021-03-17 MED ORDER — VITAMIN D (ERGOCALCIFEROL) 1.25 MG (50000 UNIT) PO CAPS: 50000.0000 [IU] | ORAL_CAPSULE | ORAL | 0 refills | Status: DC

## 2021-03-17 MED ORDER — OZEMPIC (0.25 OR 0.5 MG/DOSE) 2 MG/1.5ML ~~LOC~~ SOPN
0.5000 mg | PEN_INJECTOR | SUBCUTANEOUS | 0 refills | Status: DC
Start: 2021-03-17 — End: 2021-04-20

## 2021-03-17 MED ORDER — LISDEXAMFETAMINE DIMESYLATE 20 MG PO CAPS: 20.0000 mg | ORAL_CAPSULE | Freq: Every morning | ORAL | 0 refills | Status: DC

## 2021-03-23 ENCOUNTER — Encounter (INDEPENDENT_AMBULATORY_CARE_PROVIDER_SITE_OTHER): Payer: Self-pay

## 2021-03-25 NOTE — Progress Notes (Signed)
Chief Complaint:   OBESITY Jody [name] is here to discuss her progress with her obesity treatment plan along with follow-up of her obesity related diagnoses.   Today's visit was #: 2 Starting weight: 233 lbs Starting date: 03/03/2021 Today's weight: 234 lbs Today's date: 03/17/2021 Weight change since last visit: +1 lb Total lbs lost to date: +1 lb Body mass index is 34.56 kg/m.   Interim History:  Jody [name] is taking dose #3 of Ozempic 0.25 mg today.  She says it is helping her hunger.  She says she still has the feeling of a "lack of self control".  She says she read about Vyvanse and is interested in trying it.  Current Meal Plan: the Category 2 Plan for 15-20% of the time.  Current Exercise Plan: None. Current Anti-Obesity Medications: Ozempic 0.25 mg subcutaneously weekly. Side effects: None.  Assessment/Plan:   Meds ordered this encounter  Medications   lisdexamfetamine (VYVANSE) 20 MG capsule    Sig: Take 1 capsule (20 mg total) by mouth every morning.    Dispense:  30 capsule    Refill:  0   Vitamin D, Ergocalciferol, (DRISDOL) 1.25 MG (50000 UNIT) CAPS capsule    Sig: Take 1 capsule (50,000 Units total) by mouth every 7 (seven) days.    Dispense:  4 capsule    Refill:  0   Semaglutide,0.25 or 0.5MG /DOS, (OZEMPIC, 0.25 OR 0.5 MG/DOSE,) 2 MG/1.5ML SOPN    Sig: Inject 0.5 mg into the skin once a week.    Dispense:  1.5 mL    Refill:  0    1. Vitamin D deficiency Not at goal.   Plan: Start to take prescription Vitamin D @50 ,000 IU every week as prescribed.  Follow-up for routine testing of Vitamin D, at least 2-3 times per year to avoid over-replacement.  Lab Results  Component Value Date   VD25OH 14.9 (L) 03/03/2021   VD25OH 38.3 07/09/2020   VD25OH 22.2 (L) 07/05/2019   - Start Vitamin D, Ergocalciferol, (DRISDOL) 1.25 MG (50000 UNIT) CAPS capsule; Take 1 capsule (50,000 Units total) by mouth every 7 (seven) days.  Dispense: 4 capsule; Refill: 0  2. Impaired  fasting glucose She is taking Ozempic 0.25 mg subcutaneously weekly and says it is helping control her hunger.    Plan:  Increase Ozempic to 0.5 mg subcutaneously weekly, as per below.  - Increase and refill semaglutide,0.25 or 0.5MG /DOS, (OZEMPIC, 0.25 OR 0.5 MG/DOSE,) 2 MG/1.5ML SOPN; Inject 0.5 mg into the skin once a week.  Dispense: 1.5 mL; Refill: 0  3. Binge eating disorder Jody [name] has BED and will start Vyvanse 20 mg daily, as per below.  I have consulted the [name] Controlled Substances Registry for this patient, and feel the risk/benefit ratio today is favorable for proceeding with this prescription for a controlled substance. The patient understands monitoring parameters and red flags.   People who binge eat feel as if they don't have control over how much they eat and have feelings of guilt or self-loathing after a binge eating episode. [employer] estimates that about 30 percent of adults with binge eating disorder also have a history of ADHD. The FDA has approved Vyvanse as a treatment option for both ADHD and binge eating. Vyvanse targets the brain's reward center by increasing the levels of dopamine and norepinephrine, the chemicals of the brain responsible for feelings of pleasure. Mindful eating is the recommended nutritional approach to treating BED.   - Start lisdexamfetamine (VYVANSE) 20 MG capsule;  Take 1 capsule (20 mg total) by mouth every morning.  Dispense: 30 capsule; Refill: 0  4. At risk for heart disease Due to Jody [name]'s current state of health and medical condition(s), she is at a higher risk for heart disease.  This puts the patient at much greater risk to subsequently develop cardiopulmonary conditions that can significantly affect patient's quality of life in a negative manner.    At least 8 minutes were spent on counseling Jody [name] about these concerns today. Evidence-based interventions for health behavior change were utilized today including the discussion of  self monitoring techniques, problem-solving barriers, and SMART goal setting techniques.  Specifically, regarding patient's less desirable eating habits and patterns, we employed the technique of small changes when Jody [name] has not been able to fully commit to her prudent nutritional plan.  5. Obesity, current BMI 34.7  Course: Jody [name] is currently in the action stage of change. As such, her goal is to continue with weight loss efforts.   Nutrition goals: She has agreed to the Category 2 Plan.   Exercise goals:  Increase activity.  Behavioral modification strategies: increasing lean protein intake, decreasing simple carbohydrates, increasing vegetables, and increasing water intake.  Jody [name] has agreed to follow-up with our clinic in 2-3 weeks. She was informed of the importance of frequent follow-up visits to maximize her success with intensive lifestyle modifications for her multiple health conditions.   Objective:   Blood pressure 140/83, pulse 79, temperature 98.5 F (36.9 C), temperature source Oral, height 5\' 9"  (1.753 m), weight 234 lb (106.1 kg), SpO2 98 %. Body mass index is 34.56 kg/m.  General: Cooperative, alert, well developed, in no acute distress. HEENT: Conjunctivae and lids unremarkable. Cardiovascular: Regular rhythm.  Lungs: Normal work of breathing. Neurologic: No focal deficits.   Lab Results  Component Value Date   CREATININE 0.80 03/03/2021   BUN 12 03/03/2021   NA 141 03/03/2021   K 4.5 03/03/2021   CL 107 (H) 03/03/2021   CO2 21 03/03/2021   Lab Results  Component Value Date   ALT 13 03/03/2021   AST 10 03/03/2021   ALKPHOS 73 03/03/2021   BILITOT <0.2 03/03/2021   Lab Results  Component Value Date   HGBA1C 6.1 (H) 01/05/2021   HGBA1C 6.0 (H) 07/09/2020   HGBA1C 5.6 12/05/2019   HGBA1C 5.8 (H) 07/05/2019   HGBA1C 5.9 (H) 11/02/2018   Lab Results  Component Value Date   INSULIN 27.5 (H) 07/09/2020   INSULIN 20.4 02/12/2020   INSULIN 28.4  (H) 12/05/2019   Lab Results  Component Value Date   TSH 0.956 03/03/2021   Lab Results  Component Value Date   CHOL 213 (H) 03/03/2021   HDL 72 03/03/2021   LDLCALC 123 (H) 03/03/2021   TRIG 101 03/03/2021   CHOLHDL 3.0 03/03/2021   Lab Results  Component Value Date   VD25OH 14.9 (L) 03/03/2021   VD25OH 38.3 07/09/2020   VD25OH 22.2 (L) 07/05/2019   Lab Results  Component Value Date   WBC 6.0 03/03/2021   HGB 11.8 03/03/2021   HCT 36.5 03/03/2021   MCV 79 03/03/2021   PLT 371 03/03/2021   Lab Results  Component Value Date   IRON 44 03/03/2021   TIBC 350 03/03/2021   FERRITIN 52 03/03/2021   Attestation Statements:   Reviewed by clinician on day of visit: allergies, medications, problem list, medical history, surgical history, family history, social history, and previous encounter notes.  I, 03/05/2021, CMA, am acting as Insurance claims handler  for Briscoe Deutscher, DO  I have reviewed the above documentation for accuracy and completeness, and I agree with the above. Briscoe Deutscher, DO

## 2021-03-27 ENCOUNTER — Encounter: Payer: Self-pay | Admitting: Internal Medicine

## 2021-04-07 ENCOUNTER — Encounter: Payer: Self-pay | Admitting: Nurse Practitioner

## 2021-04-07 ENCOUNTER — Other Ambulatory Visit: Payer: Self-pay

## 2021-04-07 ENCOUNTER — Ambulatory Visit: Payer: BC Managed Care – PPO | Admitting: Nurse Practitioner

## 2021-04-07 VITALS — BP 128/72 | HR 66 | Temp 98.1°F | Ht 69.0 in | Wt 230.0 lb

## 2021-04-07 DIAGNOSIS — I10 Essential (primary) hypertension: Secondary | ICD-10-CM

## 2021-04-07 DIAGNOSIS — E6609 Other obesity due to excess calories: Secondary | ICD-10-CM | POA: Diagnosis not present

## 2021-04-07 DIAGNOSIS — Z6834 Body mass index (BMI) 34.0-34.9, adult: Secondary | ICD-10-CM

## 2021-04-07 NOTE — Progress Notes (Signed)
I,Malyah Ohlrich,acting as a Neurosurgeon for Arnette Felts, FNP.,have documented all relevant documentation on the behalf of Arnette Felts, FNP,as directed by  Arnette Felts, FNP while in the presence of Arnette Felts, FNP.  This visit occurred during the SARS-CoV-2 public health emergency.  Safety protocols were in place, including screening questions prior to the visit, additional usage of staff PPE, and extensive cleaning of exam room while observing appropriate contact time as indicated for disinfecting solutions.  Subjective:     Patient ID: Jody [name] , female    DOB: October 26, 1970 , 50 y.o.   MRN: [id number]   Chief Complaint  Patient presents with   Hypertension    HPI  Pt is here today for htn follow up. She has no questions or concerns. Taking meds as directed. She is currently taking lisinopril hydrochlorothiazide 20-12.5. Continues with healthy weight and wellness next appt is next month   Wt Readings from Last 3 Encounters: 04/07/21 : 230 lb (104.3 kg) 03/17/21 : 234 lb (106.1 kg) 03/03/21 : 233 lb (105.7 kg)    Hypertension This is a chronic problem. The current episode started more than 1 year ago. The problem is unchanged. The problem is controlled. Pertinent negatives include no chest pain, headaches, orthopnea, palpitations or shortness of breath. There are no associated agents to hypertension. Risk factors for coronary artery disease include obesity and sedentary lifestyle. The current treatment provides moderate improvement. Compliance problems include exercise.     Past Medical History:  Diagnosis Date   H/O amenorrhea    History of chicken pox    Hx of mumps    Hyperlipidemia    Hypertension    Low iron    Prediabetes    Yeast infection      Family History  Problem Relation Age of Onset   Hypertension Mother    Diabetes Mother    Hypertension Maternal Grandmother      Current Outpatient Medications:    lisdexamfetamine (VYVANSE) 20 MG capsule, Take 1  capsule (20 mg total) by mouth every morning., Disp: 30 capsule, Rfl: 0   lisinopril-hydrochlorothiazide (ZESTORETIC) 20-12.5 MG tablet, TAKE 1 TABLET BY MOUTH EVERY DAY, Disp: 90 tablet, Rfl: 2   Semaglutide,0.25 or 0.5MG /DOS, (OZEMPIC, 0.25 OR 0.5 MG/DOSE,) 2 MG/1.5ML SOPN, Inject 0.5 mg into the skin once a week., Disp: 1.5 mL, Rfl: 0   Vitamin D, Ergocalciferol, (DRISDOL) 1.25 MG (50000 UNIT) CAPS capsule, Take 1 capsule (50,000 Units total) by mouth every 7 (seven) days., Disp: 4 capsule, Rfl: 0   No Known Allergies   Review of Systems  Constitutional: Negative.   Respiratory: Negative.  Negative for shortness of breath.   Cardiovascular: Negative.  Negative for chest pain, palpitations and orthopnea.  Neurological: Negative.  Negative for headaches.  Psychiatric/Behavioral: Negative.      Today's Vitals   04/07/21 0904  BP: 128/72  Pulse: 66  Temp: 98.1 F (36.7 C)  Weight: 230 lb (104.3 kg)  Height: 5\' 9"  (1.753 m)   Body mass index is 33.97 kg/m.   Objective:  Physical Exam Vitals and nursing note reviewed.  Constitutional:      General: She is not in acute distress.    Appearance: Normal appearance. She is obese.  Cardiovascular:     Rate and Rhythm: Normal rate and regular rhythm.     Pulses: Normal pulses.     Heart sounds: Normal heart sounds. No murmur heard. Pulmonary:     Effort: Pulmonary effort is normal. No respiratory distress.  Breath sounds: Normal breath sounds. No wheezing.  Skin:    General: Skin is warm.  Neurological:     General: No focal deficit present.     Mental Status: She is alert and oriented to person, place, and time.     Cranial Nerves: No cranial nerve deficit.  Psychiatric:        Mood and Affect: Mood normal.        Behavior: Behavior normal.        Thought Content: Thought content normal.        Judgment: Judgment normal.        Assessment And Plan:     1. Essential hypertension, benign Comments: Blood pressure is  well controlled  2. Class 1 obesity due to excess calories without serious comorbidity with body mass index (BMI) of 34.0 to 34.9 in adult Comments: Continue with healthy weight and wellness Contiue on Vyvanse as directed by healthy weight and wellness    Patient was given opportunity to ask questions. Patient verbalized understanding of the plan and was able to repeat key elements of the plan. All questions were answered to their satisfaction.  Arnette Felts, FNP   I, Arnette Felts, FNP, have reviewed all documentation for this visit. The documentation on 04/07/21 for the exam, diagnosis, procedures, and orders are all accurate and complete.   IF YOU HAVE BEEN REFERRED TO A SPECIALIST, IT MAY TAKE 1-2 WEEKS TO SCHEDULE/PROCESS THE REFERRAL. IF YOU HAVE NOT HEARD FROM US/SPECIALIST IN TWO WEEKS, PLEASE GIVE [place] A CALL AT [phone] X [phone].   THE PATIENT IS ENCOURAGED TO PRACTICE SOCIAL DISTANCING DUE TO THE COVID-19 PANDEMIC.

## 2021-04-07 NOTE — Patient Instructions (Signed)

## 2021-04-08 ENCOUNTER — Ambulatory Visit (INDEPENDENT_AMBULATORY_CARE_PROVIDER_SITE_OTHER): Payer: BC Managed Care – PPO | Admitting: Adult Health

## 2021-04-08 ENCOUNTER — Encounter (INDEPENDENT_AMBULATORY_CARE_PROVIDER_SITE_OTHER): Payer: Self-pay | Admitting: Adult Health

## 2021-04-08 VITALS — BP 107/67 | HR 75 | Temp 98.2°F | Ht 69.0 in | Wt 226.0 lb

## 2021-04-08 DIAGNOSIS — E669 Obesity, unspecified: Secondary | ICD-10-CM | POA: Diagnosis not present

## 2021-04-08 DIAGNOSIS — I10 Essential (primary) hypertension: Secondary | ICD-10-CM

## 2021-04-08 DIAGNOSIS — F5081 Binge eating disorder: Secondary | ICD-10-CM

## 2021-04-08 DIAGNOSIS — R7301 Impaired fasting glucose: Secondary | ICD-10-CM

## 2021-04-08 DIAGNOSIS — Z6834 Body mass index (BMI) 34.0-34.9, adult: Secondary | ICD-10-CM

## 2021-04-13 NOTE — Progress Notes (Signed)
Chief Complaint:   OBESITY Jody [name] is here to discuss her progress with her obesity treatment plan along with follow-up of her obesity related diagnoses. Jody [name] is on the Category 2 Plan and states she is following her eating plan approximately 45% of the time. Jody [name] states she is not exercising regularly at this time.  Today's visit was #: 3 Starting weight: 233 lbs Starting date: 03/03/2021 Today's weight: 226 lbs Today's date: 04/08/2021 Total lbs lost to date: 7 lbs Total lbs lost since last in-office visit: 8 lbs  Interim History: Jody [name] was started on Ozempic 0.25 mg on 03/03/2021.  Dose was increased to 0.5 mg at last office visit on 03/17/2021.  She has had 2 doses of Ozempic 0.5 mg.  Denies medication side effects.  Subjective:   1. Essential hypertension Blood pressure and heart rate are excellent at office visit.  She is on lisinopril/HCTZ 20/12.5 mg daily.  She denies dizziness with position change or increased fatigue.  BP Readings from Last 3 Encounters:  04/08/21 107/67  04/07/21 128/72  03/17/21 140/83   2. Impaired fasting glucose Jody [name] was started on Ozempic 0.25 mg on 03/03/2021.  Dose was increased to 0.5 mg at last office visit on 03/17/2021.  She has had 2 doses of Ozempic 0.5 mg.  Denies medication side effects.  3. Binge eating disorder PDMP reviewed - no aberrancies noted.  Started on Vyvanse 20 mg every morning on 03/17/2021.  She denies palpitations or insomnia.  Assessment/Plan:   1. Essential hypertension Jody [name] is working on healthy weight loss and exercise to improve blood pressure control. We will watch for signs of hypotension as she continues her lifestyle modifications.  Monitor of symptoms of hypotension.  2. Impaired fasting glucose Continue Ozempic 0.5 mg weekly.  No need for refill.  3. Binge eating disorder Continue Vyvanse 20 mg daily.  No need for refill.  4. Obesity, current BMI 33.4  Jody [name] is currently in the action  stage of change. As such, her goal is to continue with weight loss efforts. She has agreed to the Category 2 Plan.   Exercise goals:  Walking.  Behavioral modification strategies: increasing lean protein intake, decreasing simple carbohydrates, increasing water intake, meal planning and cooking strategies, keeping healthy foods in the home, and planning for success.  Jody [name] has agreed to follow-up with our clinic in 2 weeks. She was informed of the importance of frequent follow-up visits to maximize her success with intensive lifestyle modifications for her multiple health conditions.   Objective:   Blood pressure 107/67, pulse 75, temperature 98.2 F (36.8 C), height 5\' 9"  (1.753 m), weight 226 lb (102.5 kg), SpO2 100 %. Body mass index is 33.37 kg/m.  General: Cooperative, alert, well developed, in no acute distress. HEENT: Conjunctivae and lids unremarkable. Cardiovascular: Regular rhythm.  Lungs: Normal work of breathing. Neurologic: No focal deficits.   Lab Results  Component Value Date   CREATININE 0.80 03/03/2021   BUN 12 03/03/2021   NA 141 03/03/2021   K 4.5 03/03/2021   CL 107 (H) 03/03/2021   CO2 21 03/03/2021   Lab Results  Component Value Date   ALT 13 03/03/2021   AST 10 03/03/2021   ALKPHOS 73 03/03/2021   BILITOT <0.2 03/03/2021   Lab Results  Component Value Date   HGBA1C 6.1 (H) 01/05/2021   HGBA1C 6.0 (H) 07/09/2020   HGBA1C 5.6 12/05/2019   HGBA1C 5.8 (H) 07/05/2019   HGBA1C 5.9 (H) 11/02/2018   Lab  Results  Component Value Date   INSULIN 27.5 (H) 07/09/2020   INSULIN 20.4 02/12/2020   INSULIN 28.4 (H) 12/05/2019   Lab Results  Component Value Date   TSH 0.956 03/03/2021   Lab Results  Component Value Date   CHOL 213 (H) 03/03/2021   HDL 72 03/03/2021   LDLCALC 123 (H) 03/03/2021   TRIG 101 03/03/2021   CHOLHDL 3.0 03/03/2021   Lab Results  Component Value Date   VD25OH 14.9 (L) 03/03/2021   VD25OH 38.3 07/09/2020   VD25OH 22.2  (L) 07/05/2019   Lab Results  Component Value Date   WBC 6.0 03/03/2021   HGB 11.8 03/03/2021   HCT 36.5 03/03/2021   MCV 79 03/03/2021   PLT 371 03/03/2021   Lab Results  Component Value Date   IRON 44 03/03/2021   TIBC 350 03/03/2021   FERRITIN 52 03/03/2021   Attestation Statements:   Reviewed by clinician on day of visit: allergies, medications, problem list, medical history, surgical history, family history, social history, and previous encounter notes.  Time spent on visit including pre-visit chart review and post-visit care and charting was 30 minutes.   I, Insurance claims handler, CMA, am acting as Energy manager for William Hamburger, NP.  I have reviewed the above documentation for accuracy and completeness, and I agree with the above. -  Xavious Sharrar d. Chevelle Durr, NP-C

## 2021-04-19 ENCOUNTER — Other Ambulatory Visit (INDEPENDENT_AMBULATORY_CARE_PROVIDER_SITE_OTHER): Payer: Self-pay | Admitting: Family Medicine

## 2021-04-19 DIAGNOSIS — E559 Vitamin D deficiency, unspecified: Secondary | ICD-10-CM

## 2021-04-19 DIAGNOSIS — R7301 Impaired fasting glucose: Secondary | ICD-10-CM

## 2021-04-20 NOTE — Telephone Encounter (Signed)
LAST APPOINTMENT DATE: 04/08/21 NEXT APPOINTMENT DATE: 04/29/21   [id number] - [place], [place] - [address] [place] [address] Phone: [phone] Fax: [phone]  [facility] [address] [place] [address] Phone: [phone] Fax: [phone]  Patient is requesting a refill of the following medications: Pending Prescriptions:                       Disp   Refills   OZEMPIC, 0.25 OR 0.5 MG/DOSE, 2 MG/1.5ML S*               Sig: Inject 0.5 mg into the skin once a week.   Vitamin D, Ergocalciferol, (DRISDOL) 1.25 *4 caps*0       Sig: Take 1 capsule (50,000 Units total) by mouth every 7          (seven) days.  Ozempic Date last filled: 03/24/2021 Previously prescribed by Dr Earlene Plater  Vit.D Filled- 03/17/21 By Dr.Wallace  Lab Results      Component                Value               Date                      HGBA1C                   6.1 (H)             01/05/2021                HGBA1C                   6.0 (H)             07/09/2020                HGBA1C                   5.6                 12/05/2019           Lab Results      Component                Value               Date                      MICROALBUR               30                  07/09/2020                LDLCALC                  123 (H)             03/03/2021                CREATININE               0.80                03/03/2021           Lab Results      Component  Value               Date                      VD25OH                   14.9 (L)            03/03/2021                VD25OH                   38.3                07/09/2020                VD25OH                   22.2 (L)            07/05/2019            BP Readings from Last 3 Encounters: 04/08/21 : 107/67 04/07/21 : 128/72 03/17/21 : 140/83

## 2021-04-20 NOTE — Telephone Encounter (Signed)
Last OV with Katy 

## 2021-04-22 ENCOUNTER — Other Ambulatory Visit (INDEPENDENT_AMBULATORY_CARE_PROVIDER_SITE_OTHER): Payer: Self-pay | Admitting: Family Medicine

## 2021-04-22 DIAGNOSIS — F5081 Binge eating disorder: Secondary | ICD-10-CM

## 2021-04-22 NOTE — Telephone Encounter (Signed)
LAST APPOINTMENT DATE: 04/21/21 NEXT APPOINTMENT DATE: 04/29/21   [id number] - [place], [place] - [address] [place] [address] Phone: [phone] Fax: [phone]  [facility] [address] [place] [address] Phone: [phone] Fax: [phone]  Patient is requesting a refill of the following medications: Pending Prescriptions:                       Disp   Refills   lisdexamfetamine (VYVANSE) 20 MG capsule   30 cap*0       Sig: Take 1 capsule (20 mg total) by mouth every morning.   Date last filled: 03/17/21 Previously prescribed by Dr.Wallace  Lab Results      Component                Value               Date                      HGBA1C                   6.1 (H)             01/05/2021                HGBA1C                   6.0 (H)             07/09/2020                HGBA1C                   5.6                 12/05/2019           Lab Results      Component                Value               Date                      MICROALBUR               30                  07/09/2020                LDLCALC                  123 (H)             03/03/2021                CREATININE               0.80                03/03/2021           Lab Results      Component                Value               Date                      VD25OH  14.9 (L)            03/03/2021                VD25OH                   38.3                07/09/2020                VD25OH                   22.2 (L)            07/05/2019            BP Readings from Last 3 Encounters: 04/08/21 : 107/67 04/07/21 : 128/72 03/17/21 : 140/83

## 2021-04-29 ENCOUNTER — Ambulatory Visit (INDEPENDENT_AMBULATORY_CARE_PROVIDER_SITE_OTHER): Payer: BC Managed Care – PPO | Admitting: Family Medicine

## 2021-04-29 ENCOUNTER — Encounter (INDEPENDENT_AMBULATORY_CARE_PROVIDER_SITE_OTHER): Payer: Self-pay | Admitting: Family Medicine

## 2021-04-29 ENCOUNTER — Other Ambulatory Visit: Payer: Self-pay

## 2021-04-29 VITALS — BP 122/77 | HR 73 | Temp 98.4°F | Ht 69.0 in | Wt 228.0 lb

## 2021-04-29 DIAGNOSIS — E8881 Metabolic syndrome: Secondary | ICD-10-CM | POA: Diagnosis not present

## 2021-04-29 DIAGNOSIS — I10 Essential (primary) hypertension: Secondary | ICD-10-CM | POA: Diagnosis not present

## 2021-04-29 DIAGNOSIS — Z6834 Body mass index (BMI) 34.0-34.9, adult: Secondary | ICD-10-CM

## 2021-04-29 DIAGNOSIS — F5081 Binge eating disorder: Secondary | ICD-10-CM | POA: Diagnosis not present

## 2021-04-29 DIAGNOSIS — E669 Obesity, unspecified: Secondary | ICD-10-CM | POA: Diagnosis not present

## 2021-04-29 MED ORDER — LISDEXAMFETAMINE DIMESYLATE 30 MG PO CAPS: 30.0000 mg | ORAL_CAPSULE | Freq: Every morning | ORAL | 0 refills | Status: DC

## 2021-04-30 ENCOUNTER — Encounter (INDEPENDENT_AMBULATORY_CARE_PROVIDER_SITE_OTHER): Payer: Self-pay

## 2021-05-04 NOTE — Progress Notes (Signed)
Chief Complaint:   OBESITY Jody [name] is here to discuss her progress with her obesity treatment plan along with follow-up of her obesity related diagnoses.   Today's visit was #: 4 Starting weight: 233 lbs Starting date: 03/03/2021 Today's weight: 228 lbs Today's date: 04/29/2021 Weight change since last visit: +2 lbs Total lbs lost to date: 5 lbs Body mass index is 33.67 kg/m.  Total weight loss percentage to date: -2.15%  Current Meal Plan: the Category 2 Plan for 45% of the time.  Current Exercise Plan: None. Current Anti-Obesity Medications: Ozempic 0.5 mg subcutaneously weekly. Side effects: None.  Interim History:  Jody [name] says she is back on track with a more routine schedule.  She has increased her water intake.  Assessment/Plan:   1. Essential hypertension At goal. Medications: Zestoretic 20-12.5 mg daily.   Plan: Avoid buying foods that are: processed, frozen, or prepackaged to avoid excess salt. We will watch for signs of hypotension as she continues lifestyle modifications.  BP Readings from Last 3 Encounters:  04/29/21 122/77  04/08/21 107/67  04/07/21 128/72   Lab Results  Component Value Date   CREATININE 0.80 03/03/2021   2. Insulin resistance Not at goal. Goal is HgbA1c < 5.7, fasting insulin closer to 5.  Medication: Ozempic 0.5 mg subcutaneously weekly.    Plan:  She will continue to focus on protein-rich, low simple carbohydrate foods. We reviewed the importance of hydration, regular exercise for stress reduction, and restorative sleep.   Lab Results  Component Value Date   HGBA1C 6.1 (H) 01/05/2021   Lab Results  Component Value Date   INSULIN 27.5 (H) 07/09/2020   INSULIN 20.4 02/12/2020   INSULIN 28.4 (H) 12/05/2019   3. Binge eating disorder Improved. Tziporah is taking Vyvanse 20 mg daily.    Plan: The current medical regimen is effective;  continue present plan and medications.  I have consulted the [place] Controlled Substances  Registry for this patient, and feel the risk/benefit ratio today is favorable for proceeding with this prescription for a controlled substance. The patient understands monitoring parameters and red flags.   - Refill lisdexamfetamine (VYVANSE) 30 MG capsule; Take 1 capsule (30 mg total) by mouth every morning.  Dispense: 30 capsule; Refill: 0  4. Obesity, current BMI 33.8  Course: Jody [name] is currently in the action stage of change. As such, her goal is to continue with weight loss efforts.   Nutrition goals: She has agreed to keeping a food journal and adhering to recommended goals of 1000-1200 calories and 85 grams of protein.   Exercise goals: For substantial health benefits, adults should do at least 150 minutes (2 hours and 30 minutes) a week of moderate-intensity, or 75 minutes (1 hour and 15 minutes) a week of vigorous-intensity aerobic physical activity, or an equivalent combination of moderate- and vigorous-intensity aerobic activity. Aerobic activity should be performed in episodes of at least 10 minutes, and preferably, it should be spread throughout the week.  Behavioral modification strategies: increasing lean protein intake, decreasing simple carbohydrates, increasing vegetables, and increasing water intake.  [name] has agreed to follow-up with our clinic in 4 weeks. She was informed of the importance of frequent follow-up visits to maximize her success with intensive lifestyle modifications for her multiple health conditions.   Objective:   Blood pressure 122/77, pulse 73, temperature 98.4 F (36.9 C), height 5\' 9"  (1.753 m), weight 228 lb (103.4 kg), SpO2 99 %. Body mass index is 33.67 kg/m.  General: Cooperative, alert,  well developed, in no acute distress. HEENT: Conjunctivae and lids unremarkable. Cardiovascular: Regular rhythm.  Lungs: Normal work of breathing. Neurologic: No focal deficits.   Lab Results  Component Value Date   CREATININE 0.80 03/03/2021   BUN 12  03/03/2021   NA 141 03/03/2021   K 4.5 03/03/2021   CL 107 (H) 03/03/2021   CO2 21 03/03/2021   Lab Results  Component Value Date   ALT 13 03/03/2021   AST 10 03/03/2021   ALKPHOS 73 03/03/2021   BILITOT <0.2 03/03/2021   Lab Results  Component Value Date   HGBA1C 6.1 (H) 01/05/2021   HGBA1C 6.0 (H) 07/09/2020   HGBA1C 5.6 12/05/2019   HGBA1C 5.8 (H) 07/05/2019   HGBA1C 5.9 (H) 11/02/2018   Lab Results  Component Value Date   INSULIN 27.5 (H) 07/09/2020   INSULIN 20.4 02/12/2020   INSULIN 28.4 (H) 12/05/2019   Lab Results  Component Value Date   TSH 0.956 03/03/2021   Lab Results  Component Value Date   CHOL 213 (H) 03/03/2021   HDL 72 03/03/2021   LDLCALC 123 (H) 03/03/2021   TRIG 101 03/03/2021   CHOLHDL 3.0 03/03/2021   Lab Results  Component Value Date   VD25OH 14.9 (L) 03/03/2021   VD25OH 38.3 07/09/2020   VD25OH 22.2 (L) 07/05/2019   Lab Results  Component Value Date   WBC 6.0 03/03/2021   HGB 11.8 03/03/2021   HCT 36.5 03/03/2021   MCV 79 03/03/2021   PLT 371 03/03/2021   Lab Results  Component Value Date   IRON 44 03/03/2021   TIBC 350 03/03/2021   FERRITIN 52 03/03/2021   Attestation Statements:   Reviewed by clinician on day of visit: allergies, medications, problem list, medical history, surgical history, family history, social history, and previous encounter notes.  I, Insurance claims handler, CMA, am acting as transcriptionist for Helane Rima, DO  I have reviewed the above documentation for accuracy and completeness, and I agree with the above. Helane Rima, DO

## 2021-05-12 ENCOUNTER — Ambulatory Visit (INDEPENDENT_AMBULATORY_CARE_PROVIDER_SITE_OTHER): Payer: BC Managed Care – PPO | Admitting: Adult Health

## 2021-05-17 ENCOUNTER — Other Ambulatory Visit (INDEPENDENT_AMBULATORY_CARE_PROVIDER_SITE_OTHER): Payer: Self-pay | Admitting: Adult Health

## 2021-05-17 DIAGNOSIS — E559 Vitamin D deficiency, unspecified: Secondary | ICD-10-CM

## 2021-05-19 NOTE — Telephone Encounter (Signed)
Dr.Wallace °

## 2021-05-21 ENCOUNTER — Other Ambulatory Visit (INDEPENDENT_AMBULATORY_CARE_PROVIDER_SITE_OTHER): Payer: Self-pay | Admitting: Adult Health

## 2021-05-21 DIAGNOSIS — R7301 Impaired fasting glucose: Secondary | ICD-10-CM

## 2021-05-21 NOTE — Telephone Encounter (Signed)
Dr.Wallace °

## 2021-06-03 ENCOUNTER — Ambulatory Visit (INDEPENDENT_AMBULATORY_CARE_PROVIDER_SITE_OTHER): Payer: BC Managed Care – PPO | Admitting: Family Medicine

## 2021-06-08 ENCOUNTER — Encounter (INDEPENDENT_AMBULATORY_CARE_PROVIDER_SITE_OTHER): Payer: Self-pay | Admitting: Family Medicine

## 2021-06-08 ENCOUNTER — Other Ambulatory Visit: Payer: Self-pay

## 2021-06-08 ENCOUNTER — Ambulatory Visit (INDEPENDENT_AMBULATORY_CARE_PROVIDER_SITE_OTHER): Payer: BC Managed Care – PPO | Admitting: Family Medicine

## 2021-06-08 VITALS — BP 122/81 | HR 79 | Temp 98.2°F | Ht 69.0 in | Wt 221.0 lb

## 2021-06-08 DIAGNOSIS — E669 Obesity, unspecified: Secondary | ICD-10-CM

## 2021-06-08 DIAGNOSIS — R7301 Impaired fasting glucose: Secondary | ICD-10-CM

## 2021-06-08 DIAGNOSIS — Z9189 Other specified personal risk factors, not elsewhere classified: Secondary | ICD-10-CM | POA: Diagnosis not present

## 2021-06-08 DIAGNOSIS — Z6834 Body mass index (BMI) 34.0-34.9, adult: Secondary | ICD-10-CM

## 2021-06-08 DIAGNOSIS — F5081 Binge eating disorder: Secondary | ICD-10-CM | POA: Diagnosis not present

## 2021-06-08 DIAGNOSIS — F50819 Binge eating disorder, unspecified: Secondary | ICD-10-CM

## 2021-06-08 MED ORDER — VYVANSE 40 MG PO CHEW: 40.0000 mg | CHEWABLE_TABLET | Freq: Every day | ORAL | 0 refills | Status: DC

## 2021-06-08 MED ORDER — OZEMPIC (1 MG/DOSE) 4 MG/3ML ~~LOC~~ SOPN: 1.0000 mg | PEN_INJECTOR | SUBCUTANEOUS | 1 refills | Status: DC

## 2021-06-08 NOTE — Progress Notes (Signed)
Chief Complaint:   OBESITY Jody [name] is here to discuss her progress with her obesity treatment plan along with follow-up of her obesity related diagnoses.   Today's visit was #: 5 Starting weight: 233 lbs Starting date: 03/03/2021 Today's weight: 221 lbs Today's date: 06/08/2021 Weight change since last visit: 7 lbs Total lbs lost to date: 12 lbs Body mass index is 32.64 kg/m.  Total weight loss percentage to date: -5.15%  Current Meal Plan: keeping a food journal and adhering to recommended goals of 1000-1200 calories and 85 grams of protein for 75% of the time.  Current Exercise Plan: None. Current Anti-Obesity Medications: Ozempic 0.5 mg subcutaneously weekly. Side effects: None.  Interim History:  Jody [name] is doing well.  She says she has less polyphagia and less emotional eating.  Assessment/Plan:   1. Impaired fasting glucose, with polyphagia Improving, but not optimized. Current treatment: Ozempic 0.5 mg subcutaneously weekly. She will continue to focus on protein-rich, low simple carbohydrate foods. We reviewed the importance of hydration, regular exercise for stress reduction, and restorative sleep.  Plan:  Increase Ozempic to 1 mg subcutaneously weekly, as per below.  - Increase and refill Semaglutide, 1 MG/DOSE, (OZEMPIC, 1 MG/DOSE,) 4 MG/3ML SOPN; Inject 1 mg into the skin once a week.  Dispense: 3 mL; Refill: 1  2. Binge eating disorder Jody [name] is taking Vyvanse 30 mg daily for BED.  Plan:  Increase Vyvanse to 40 mg daily.  - Increase Lisdexamfetamine Dimesylate (VYVANSE) 40 MG CHEW; Chew 40 mg by mouth daily at 12 noon.  Dispense: 30 tablet; Refill: 0  I have consulted the [place] Controlled Substances Registry for this patient, and feel the risk/benefit ratio today is favorable for proceeding with this prescription for a controlled substance. The patient understands monitoring parameters and red flags.   3. At risk for heart disease Due to Jody [name]'s current  state of health and medical condition(s), she is at a higher risk for heart disease.  This puts the patient at much greater risk to subsequently develop cardiopulmonary conditions that can significantly affect patient's quality of life in a negative manner.    At least 8 minutes were spent on counseling Jody [name] about these concerns today. Evidence-based interventions for health behavior change were utilized today including the discussion of self monitoring techniques, problem-solving barriers, and SMART goal setting techniques.  Specifically, regarding patient's less desirable eating habits and patterns, we employed the technique of small changes when Jody [name] has not been able to fully commit to her prudent nutritional plan.  4. Obesity, current BMI 32.7  Course: Jody [name] is currently in the action stage of change. As such, her goal is to continue with weight loss efforts.   Nutrition goals: She has agreed to keeping a food journal and adhering to recommended goals of 1000-1200 calories and 85 grams of protein.   Exercise goals:  Increase NEAT.  Behavioral modification strategies: increasing lean protein intake, decreasing simple carbohydrates, increasing vegetables, and increasing water intake.  Jody [name] has agreed to follow-up with our clinic in 4 weeks. She was informed of the importance of frequent follow-up visits to maximize her success with intensive lifestyle modifications for her multiple health conditions.   Objective:   Blood pressure 122/81, pulse 79, temperature 98.2 F (36.8 C), temperature source Oral, height 5\' 9"  (1.753 m), weight 221 lb (100.2 kg), SpO2 99 %. Body mass index is 32.64 kg/m.  General: Cooperative, alert, well developed, in no acute distress. HEENT: Conjunctivae and lids unremarkable. Cardiovascular: Regular  rhythm.  Lungs: Normal work of breathing. Neurologic: No focal deficits.   Lab Results  Component Value Date   CREATININE 0.80 03/03/2021   BUN  12 03/03/2021   NA 141 03/03/2021   K 4.5 03/03/2021   CL 107 (H) 03/03/2021   CO2 21 03/03/2021   Lab Results  Component Value Date   ALT 13 03/03/2021   AST 10 03/03/2021   ALKPHOS 73 03/03/2021   BILITOT <0.2 03/03/2021   Lab Results  Component Value Date   HGBA1C 6.1 (H) 01/05/2021   HGBA1C 6.0 (H) 07/09/2020   HGBA1C 5.6 12/05/2019   HGBA1C 5.8 (H) 07/05/2019   HGBA1C 5.9 (H) 11/02/2018   Lab Results  Component Value Date   INSULIN 27.5 (H) 07/09/2020   INSULIN 20.4 02/12/2020   INSULIN 28.4 (H) 12/05/2019   Lab Results  Component Value Date   TSH 0.956 03/03/2021   Lab Results  Component Value Date   CHOL 213 (H) 03/03/2021   HDL 72 03/03/2021   LDLCALC 123 (H) 03/03/2021   TRIG 101 03/03/2021   CHOLHDL 3.0 03/03/2021   Lab Results  Component Value Date   VD25OH 14.9 (L) 03/03/2021   VD25OH 38.3 07/09/2020   VD25OH 22.2 (L) 07/05/2019   Lab Results  Component Value Date   WBC 6.0 03/03/2021   HGB 11.8 03/03/2021   HCT 36.5 03/03/2021   MCV 79 03/03/2021   PLT 371 03/03/2021   Lab Results  Component Value Date   IRON 44 03/03/2021   TIBC 350 03/03/2021   FERRITIN 52 03/03/2021   Attestation Statements:   Reviewed by clinician on day of visit: allergies, medications, problem list, medical history, surgical history, family history, social history, and previous encounter notes.  I, Insurance claims handler, CMA, am acting as transcriptionist for Helane Rima, DO  I have reviewed the above documentation for accuracy and completeness, and I agree with the above. Helane Rima, DO

## 2021-06-12 ENCOUNTER — Other Ambulatory Visit (INDEPENDENT_AMBULATORY_CARE_PROVIDER_SITE_OTHER): Payer: Self-pay | Admitting: Family Medicine

## 2021-06-12 DIAGNOSIS — F5081 Binge eating disorder: Secondary | ICD-10-CM

## 2021-06-15 NOTE — Telephone Encounter (Signed)
Pt last seen by Dr. Wallace.  

## 2021-06-16 ENCOUNTER — Encounter (INDEPENDENT_AMBULATORY_CARE_PROVIDER_SITE_OTHER): Payer: Self-pay

## 2021-07-08 ENCOUNTER — Encounter (INDEPENDENT_AMBULATORY_CARE_PROVIDER_SITE_OTHER): Payer: Self-pay | Admitting: Family Medicine

## 2021-07-08 ENCOUNTER — Other Ambulatory Visit: Payer: Self-pay

## 2021-07-08 ENCOUNTER — Ambulatory Visit (INDEPENDENT_AMBULATORY_CARE_PROVIDER_SITE_OTHER): Payer: BC Managed Care – PPO | Admitting: Family Medicine

## 2021-07-08 VITALS — BP 110/76 | HR 77 | Temp 98.3°F | Ht 69.0 in | Wt 211.0 lb

## 2021-07-08 DIAGNOSIS — R7301 Impaired fasting glucose: Secondary | ICD-10-CM

## 2021-07-08 DIAGNOSIS — F5081 Binge eating disorder: Secondary | ICD-10-CM | POA: Diagnosis not present

## 2021-07-08 DIAGNOSIS — Z6834 Body mass index (BMI) 34.0-34.9, adult: Secondary | ICD-10-CM

## 2021-07-08 DIAGNOSIS — E669 Obesity, unspecified: Secondary | ICD-10-CM

## 2021-07-08 DIAGNOSIS — E559 Vitamin D deficiency, unspecified: Secondary | ICD-10-CM

## 2021-07-08 DIAGNOSIS — F50819 Binge eating disorder, unspecified: Secondary | ICD-10-CM

## 2021-07-09 NOTE — Progress Notes (Signed)
Chief Complaint:   OBESITY Jody [name] is here to discuss her progress with her obesity treatment plan along with follow-up of her obesity related diagnoses. See Medical Weight Management Flowsheet for complete bioelectrical impedance results.  Today's visit was #: 6 Starting weight: 233 lbs Starting date: 03/03/2021 Weight change since last visit: 10 lbs Total lbs lost to date: 22 lbs Total weight loss percentage to date: -9.44%  Nutrition Plan: Keeping a food journal and adhering to recommended goals of 1000-1200 calories and 85 grams of protein daily for 80% of the time. Activity: None. Anti-obesity medications: Ozempic 1 mg subcutaneously weekly. Reported side effects: None.  Interim History: Jody [name] is on Ozempic 1 mg subcutaneously weekly and Vyvanse 40 mg daily.  Tolerating very well, without side effects.  Happy with progress and regimen.  Assessment/Plan:   1. Impaired fasting glucose, with polyphagia Controlled. Current treatment: Ozempic 1 mg subcutaneously weekly.    Plan: Refill Ozempic 1 mg subcutaneously weekly. She will continue to focus on protein-rich, low simple carbohydrate foods. We reviewed the importance of hydration, regular exercise for stress reduction, and restorative sleep.  2. Vitamin D deficiency Not at goal.  She is taking vitamin D 50,000 IU weekly.  Plan: Continue to take prescription Vitamin D @50 ,000 IU every week as prescribed.  Will refill today.  Follow-up for routine testing of Vitamin D, at least 2-3 times per year to avoid over-replacement.  Lab Results  Component Value Date   VD25OH 14.9 (L) 03/03/2021   VD25OH 38.3 07/09/2020   VD25OH 22.2 (L) 07/05/2019   3. Binge eating disorder Improved. Jody [name] is taking Vyvanse 40 mg daily.     Plan: The current medical regimen is effective;  continue present plan and medications.  Will refill Vyvanse today for 3 months.  The goals for treatment of BED are to reduce eating binges and to  achieve healthy eating habits. Because binge eating can correlate with negative emotions, treatment may also address any other mental-health issues, such as depression.  People who binge eat feel as if they don't have control over how much they eat and have feelings of guilt or self-loathing after a binge eating episode.  The FDA has approved Vyvanse as a treatment option for binge eating disorder. Vyvanse targets the brain's reward center by increasing the levels of dopamine and norepinephrine, the chemicals of the brain responsible for feelings of pleasure.  Mindful eating is the recommended nutritional approach to treating BED.   I have consulted the [place] Controlled Substances Registry for this patient, and feel the risk/benefit ratio today is favorable for proceeding with this prescription for a controlled substance. The patient understands monitoring parameters and red flags.   4. Obesity, current BMI 31.2  Course: Jody [name] is currently in the action stage of change. As such, her goal is to continue with weight loss efforts.   Nutrition goals: She has agreed to keeping a food journal and adhering to recommended goals of 1000-1200 calories and 85 grams of protein.   Exercise goals:  As is.  Behavioral modification strategies: increasing lean protein intake, decreasing simple carbohydrates, increasing vegetables, increasing water intake, and decreasing liquid calories.  Jody [name] has agreed to follow-up with our clinic in early January. She was informed of the importance of frequent follow-up visits to maximize her success with intensive lifestyle modifications for her multiple health conditions.   Objective:   Blood pressure 110/76, pulse 77, temperature 98.3 F (36.8 C), temperature source Oral, height 5\' 9"  (1.753  m), weight 211 lb (95.7 kg), SpO2 100 %. Body mass index is 31.16 kg/m.  General: Cooperative, alert, well developed, in no acute distress. HEENT: Conjunctivae and lids  unremarkable. Cardiovascular: Regular rhythm.  Lungs: Normal work of breathing. Neurologic: No focal deficits.   Lab Results  Component Value Date   CREATININE 0.80 03/03/2021   BUN 12 03/03/2021   NA 141 03/03/2021   K 4.5 03/03/2021   CL 107 (H) 03/03/2021   CO2 21 03/03/2021   Lab Results  Component Value Date   ALT 13 03/03/2021   AST 10 03/03/2021   ALKPHOS 73 03/03/2021   BILITOT <0.2 03/03/2021   Lab Results  Component Value Date   HGBA1C 6.1 (H) 01/05/2021   HGBA1C 6.0 (H) 07/09/2020   HGBA1C 5.6 12/05/2019   HGBA1C 5.8 (H) 07/05/2019   HGBA1C 5.9 (H) 11/02/2018   Lab Results  Component Value Date   INSULIN 27.5 (H) 07/09/2020   INSULIN 20.4 02/12/2020   INSULIN 28.4 (H) 12/05/2019   Lab Results  Component Value Date   TSH 0.956 03/03/2021   Lab Results  Component Value Date   CHOL 213 (H) 03/03/2021   HDL 72 03/03/2021   LDLCALC 123 (H) 03/03/2021   TRIG 101 03/03/2021   CHOLHDL 3.0 03/03/2021   Lab Results  Component Value Date   VD25OH 14.9 (L) 03/03/2021   VD25OH 38.3 07/09/2020   VD25OH 22.2 (L) 07/05/2019   Lab Results  Component Value Date   WBC 6.0 03/03/2021   HGB 11.8 03/03/2021   HCT 36.5 03/03/2021   MCV 79 03/03/2021   PLT 371 03/03/2021   Lab Results  Component Value Date   IRON 44 03/03/2021   TIBC 350 03/03/2021   FERRITIN 52 03/03/2021   Attestation Statements:   Reviewed by clinician on day of visit: allergies, medications, problem list, medical history, surgical history, family history, social history, and previous encounter notes.  I, Insurance claims handler, CMA, am acting as transcriptionist for Helane Rima, DO  I have reviewed the above documentation for accuracy and completeness, and I agree with the above. -  Helane Rima, DO, MS, FAAFP, DABOM - Family and Bariatric Medicine.

## 2021-07-12 MED ORDER — VITAMIN D (ERGOCALCIFEROL) 1.25 MG (50000 UNIT) PO CAPS: 50000.0000 [IU] | ORAL_CAPSULE | ORAL | 0 refills | Status: DC

## 2021-07-12 MED ORDER — OZEMPIC (1 MG/DOSE) 4 MG/3ML ~~LOC~~ SOPN: 1.0000 mg | PEN_INJECTOR | SUBCUTANEOUS | 0 refills | Status: DC

## 2021-07-12 MED ORDER — VYVANSE 40 MG PO CHEW: 40.0000 mg | CHEWABLE_TABLET | Freq: Every day | ORAL | 0 refills | Status: DC

## 2021-07-22 ENCOUNTER — Encounter: Payer: BC Managed Care – PPO | Admitting: Nurse Practitioner

## 2021-07-22 ENCOUNTER — Encounter: Payer: BC Managed Care – PPO | Admitting: Internal Medicine

## 2021-07-23 ENCOUNTER — Encounter: Payer: BC Managed Care – PPO | Admitting: Internal Medicine

## 2021-08-04 ENCOUNTER — Encounter: Payer: BC Managed Care – PPO | Admitting: Nurse Practitioner

## 2021-08-05 ENCOUNTER — Other Ambulatory Visit: Payer: Self-pay

## 2021-08-05 ENCOUNTER — Encounter: Payer: Self-pay | Admitting: Nurse Practitioner

## 2021-08-05 ENCOUNTER — Ambulatory Visit: Payer: BC Managed Care – PPO | Admitting: Nurse Practitioner

## 2021-08-05 VITALS — BP 118/78 | HR 97 | Temp 98.9°F | Ht 67.4 in | Wt 210.6 lb

## 2021-08-05 DIAGNOSIS — R300 Dysuria: Secondary | ICD-10-CM | POA: Diagnosis not present

## 2021-08-05 DIAGNOSIS — Z6832 Body mass index (BMI) 32.0-32.9, adult: Secondary | ICD-10-CM

## 2021-08-05 DIAGNOSIS — R82998 Other abnormal findings in urine: Secondary | ICD-10-CM | POA: Diagnosis not present

## 2021-08-05 DIAGNOSIS — E6609 Other obesity due to excess calories: Secondary | ICD-10-CM

## 2021-08-05 DIAGNOSIS — N3001 Acute cystitis with hematuria: Secondary | ICD-10-CM

## 2021-08-05 DIAGNOSIS — Z23 Encounter for immunization: Secondary | ICD-10-CM | POA: Diagnosis not present

## 2021-08-05 LAB — POCT URINALYSIS DIPSTICK
Bilirubin, UA: NEGATIVE
Glucose, UA: NEGATIVE
Ketones, UA: NEGATIVE
Nitrite, UA: NEGATIVE
Protein, UA: POSITIVE — AB
Spec Grav, UA: 1.015 (ref 1.010–1.025)
Urobilinogen, UA: 0.2 E.U./dL
pH, UA: 6 (ref 5.0–8.0)

## 2021-08-05 MED ORDER — NITROFURANTOIN MONOHYD MACRO 100 MG PO CAPS: 100.0000 mg | ORAL_CAPSULE | Freq: Two times a day (BID) | ORAL | 0 refills | Status: AC

## 2021-08-05 NOTE — Progress Notes (Signed)
I,[name],acting as a Education administrator for [employer], NP.,have documented all relevant documentation on the behalf of [employer], NP,as directed by  Bary Castilla, NP while in the presence of Bary Castilla, NP.  This visit occurred during the SARS-CoV-2 public health emergency.  Safety protocols were in place, including screening questions prior to the visit, additional usage of staff PPE, and extensive cleaning of exam room while observing appropriate contact time as indicated for disinfecting solutions.  Subjective:     Patient ID: Jody [name] , female    DOB: Nov 21, 1970 , 50 y.o.   MRN: [id number]   Chief Complaint  Patient presents with   Dysuria    HPI  She is having some dysuria after she uses the bathroom. She also has some frequency in the urine. She went to [facility] and did a test there and they said she has bacteria in her urine. States that her period is also about to come.    Dysuria  This is a new problem. The current episode started yesterday. The problem occurs every urination. The problem has been unchanged. The quality of the pain is described as burning. The pain is mild. There has been no fever. Associated symptoms include frequency, hesitancy and urgency. Pertinent negatives include no chills. She has tried nothing for the symptoms.    Past Medical History:  Diagnosis Date   H/O amenorrhea    History of chicken pox    Hx of mumps    Hyperlipidemia    Hypertension    Low iron    Prediabetes    Yeast infection      Family History  Problem Relation Age of Onset   Hypertension Mother    Diabetes Mother    Hypertension Maternal Grandmother      Current Outpatient Medications:    Lisdexamfetamine Dimesylate (VYVANSE) 40 MG CHEW, Chew 40 mg by mouth daily at 12 noon., Disp: 30 tablet, Rfl: 0   lisinopril-hydrochlorothiazide (ZESTORETIC) 20-12.5 MG tablet, TAKE 1 TABLET BY MOUTH EVERY DAY, Disp: 90 tablet, Rfl: 2   nitrofurantoin,  macrocrystal-monohydrate, (MACROBID) 100 MG capsule, Take 1 capsule (100 mg total) by mouth 2 (two) times daily for 7 days., Disp: 14 capsule, Rfl: 0   Semaglutide, 1 MG/DOSE, (OZEMPIC, 1 MG/DOSE,) 4 MG/3ML SOPN, Inject 1 mg into the skin once a week., Disp: 9 mL, Rfl: 0   Vitamin D, Ergocalciferol, (DRISDOL) 1.25 MG (50000 UNIT) CAPS capsule, Take 1 capsule (50,000 Units total) by mouth every 7 (seven) days., Disp: 12 capsule, Rfl: 0   Lisdexamfetamine Dimesylate (VYVANSE) 40 MG CHEW, Chew 40 mg by mouth daily at 12 noon. (Patient not taking: Reported on 08/05/2021), Disp: 30 tablet, Rfl: 0   No Known Allergies   Review of Systems  Constitutional:  Negative for chills, fatigue and fever.  Respiratory:  Negative for cough and shortness of breath.   Cardiovascular:  Negative for chest pain and palpitations.  Genitourinary:  Positive for dysuria, frequency, hesitancy and urgency.  Musculoskeletal:  Negative for back pain.  Neurological:  Negative for weakness and headaches.    Today's Vitals   08/05/21 1007  BP: 118/78  Pulse: 97  Temp: 98.9 F (37.2 C)  Weight: 210 lb 9.6 oz (95.5 kg)  Height: 5' 7.4" (1.712 m)  PainSc: 0-No pain   Body mass index is 32.59 kg/m.  Wt Readings from Last 3 Encounters:  08/05/21 210 lb 9.6 oz (95.5 kg)  07/08/21 211 lb (95.7 kg)  06/08/21 221 lb (  100.2 kg)     Objective:  Physical Exam Constitutional:      Appearance: Normal appearance. She is obese.  HENT:     Head: Normocephalic and atraumatic.  Cardiovascular:     Rate and Rhythm: Normal rate and regular rhythm.     Pulses: Normal pulses.     Heart sounds: Normal heart sounds. No murmur heard. Pulmonary:     Effort: Pulmonary effort is normal. No respiratory distress.     Breath sounds: Normal breath sounds. No wheezing.  Skin:    General: Skin is warm and dry.     Capillary Refill: Capillary refill takes less than 2 seconds.  Neurological:     Mental Status: She is alert and oriented  to person, place, and time.        Assessment And Plan:     1. Dysuria - POCT Urinalysis Dipstick (81002)  2. Leukocytes in urine - Urine Culture  3. Acute cystitis with hematuria - nitrofurantoin, macrocrystal-monohydrate, (MACROBID) 100 MG capsule; Take 1 capsule (100 mg total) by mouth 2 (two) times daily for 7 days.  Dispense: 14 capsule; Refill: 0 -Patient states her period is about to start   4. Immunization due - Flu Vaccine QUAD 6+ mos PF IM (Fluarix Quad PF)  5. Class 1 obesity due to excess calories with body mass index (BMI) of 32.0 to 32.9 in adult, unspecified whether serious comorbidity present  -Advised patient on a healthy diet including avoiding fast food and red meats. Increase the intake of lean meats including grilled chicken and [place].  Drink a lot of water. Decrease intake of fatty foods. Exercise for 30-45 min. 4-5 a week to decrease the risk of cardiac event.   Prescription sent to pharmacy. Discussed medication disired effects, side effects, and how to administer medication. Urine sent for culture. Make take OTC medications as needed. Increase oral fluid intake. Follow up for worsening or persistent symptoms. Patient verbalized understanding regarding plan of care and all questions answered.    The patient was encouraged to call or send a message through MyChart for any questions or concerns.   Follow up: if symptoms persist or do not get better.   Side effects and appropriate use of all the medication(s) were discussed with the patient today. Patient advised to use the medication(s) as directed by their healthcare provider. The patient was encouraged to read, review, and understand all associated package inserts and contact our office with any questions or concerns. The patient accepts the risks of the treatment plan and had an opportunity to ask questions.   Patient was given opportunity to ask questions. Patient verbalized understanding of the plan and was  able to repeat key elements of the plan. All questions were answered to their satisfaction.  Raman Cecile Guevara, DNP   I, Raman Leovardo Thoman have reviewed all documentation for this visit. The documentation on 08/05/21 for the exam, diagnosis, procedures, and orders are all accurate and complete.   IF YOU HAVE BEEN REFERRED TO A SPECIALIST, IT MAY TAKE 1-2 WEEKS TO SCHEDULE/PROCESS THE REFERRAL. IF YOU HAVE NOT HEARD FROM US/SPECIALIST IN TWO WEEKS, PLEASE GIVE [place] A CALL AT [phone].   THE PATIENT IS ENCOURAGED TO PRACTICE SOCIAL DISTANCING DUE TO THE COVID-19 PANDEMIC.

## 2021-08-09 LAB — URINE CULTURE

## 2021-08-10 ENCOUNTER — Ambulatory Visit: Payer: BC Managed Care – PPO | Admitting: Nurse Practitioner

## 2021-08-17 ENCOUNTER — Other Ambulatory Visit: Payer: Self-pay | Admitting: Nurse Practitioner

## 2021-08-29 ENCOUNTER — Other Ambulatory Visit: Payer: Self-pay | Admitting: Nurse Practitioner

## 2021-08-29 DIAGNOSIS — N3001 Acute cystitis with hematuria: Secondary | ICD-10-CM

## 2021-09-03 ENCOUNTER — Other Ambulatory Visit (INDEPENDENT_AMBULATORY_CARE_PROVIDER_SITE_OTHER): Payer: Self-pay | Admitting: Family Medicine

## 2021-09-03 DIAGNOSIS — E559 Vitamin D deficiency, unspecified: Secondary | ICD-10-CM

## 2021-09-16 ENCOUNTER — Encounter (INDEPENDENT_AMBULATORY_CARE_PROVIDER_SITE_OTHER): Payer: Self-pay | Admitting: Family Medicine

## 2021-09-16 ENCOUNTER — Ambulatory Visit (INDEPENDENT_AMBULATORY_CARE_PROVIDER_SITE_OTHER): Payer: BC Managed Care – PPO | Admitting: Family Medicine

## 2021-09-16 ENCOUNTER — Other Ambulatory Visit: Payer: Self-pay

## 2021-09-16 VITALS — BP 128/82 | HR 109 | Temp 98.3°F | Ht 69.0 in | Wt 206.0 lb

## 2021-09-16 DIAGNOSIS — E669 Obesity, unspecified: Secondary | ICD-10-CM | POA: Diagnosis not present

## 2021-09-16 DIAGNOSIS — E559 Vitamin D deficiency, unspecified: Secondary | ICD-10-CM

## 2021-09-16 DIAGNOSIS — R7301 Impaired fasting glucose: Secondary | ICD-10-CM

## 2021-09-16 DIAGNOSIS — F5081 Binge eating disorder: Secondary | ICD-10-CM | POA: Diagnosis not present

## 2021-09-16 DIAGNOSIS — Z6834 Body mass index (BMI) 34.0-34.9, adult: Secondary | ICD-10-CM

## 2021-09-16 MED ORDER — VYVANSE 40 MG PO CHEW: 40.0000 mg | CHEWABLE_TABLET | Freq: Every day | ORAL | 0 refills | Status: DC

## 2021-09-16 MED ORDER — VITAMIN D (ERGOCALCIFEROL) 1.25 MG (50000 UNIT) PO CAPS: 50000.0000 [IU] | ORAL_CAPSULE | ORAL | 0 refills | Status: DC

## 2021-09-16 MED ORDER — OZEMPIC (1 MG/DOSE) 4 MG/3ML ~~LOC~~ SOPN: 1.0000 mg | PEN_INJECTOR | SUBCUTANEOUS | 0 refills | Status: DC

## 2021-09-16 NOTE — Progress Notes (Signed)
Chief Complaint:   OBESITY Jody [name] is here to discuss her progress with her obesity treatment plan along with follow-up of her obesity related diagnoses. See Medical Weight Management Flowsheet for complete bioelectrical impedance results.  Today's visit was #: 7 Starting weight: 233 lbs Starting date: 03/03/2021 Weight change since last visit: 5 lbs Total lbs lost to date: 27 lbs Total weight loss percentage to date: -11.59%  Nutrition Plan: Keeping a food journal and adhering to recommended goals of 1000-1200 calories and 85 grams of protein daily for 85% of the time. Activity: None Anti-obesity medications: Ozempic 1 mg subcutaneously weekly. Reported side effects: None.  Interim History: Jody [name] is doing well.  She says she is ready for the next school semester.  Still very active.  She says she is happy with her medications.  No side effects.  Denies polyphagia.  She says she is recognizing how to eat mindfully.  Assessment/Plan:   1. Impaired fasting glucose, with polyphagia At goal. Current treatment: Ozempic 1 mg subcutaneously weekly.    Plan:  Continue Ozempic 1 mg subcutaneously weekly.  Will refill today for 3 months.  She will continue to focus on protein-rich, low simple carbohydrate foods. We reviewed the importance of hydration, regular exercise for stress reduction, and restorative sleep.  - Refill Semaglutide, 1 MG/DOSE, (OZEMPIC, 1 MG/DOSE,) 4 MG/3ML SOPN; Inject 1 mg into the skin once a week.  Dispense: 9 mL; Refill: 0  2. Vitamin D deficiency Not at goal. She is taking vitamin D 50,000 IU weekly.  Plan: Continue to take prescription Vitamin D @50 ,000 IU every week as prescribed.  Follow-up for routine testing of Vitamin D, at least 2-3 times per year to avoid over-replacement.  Lab Results  Component Value Date   VD25OH 14.9 (L) 03/03/2021   VD25OH 38.3 07/09/2020   VD25OH 22.2 (L) 07/05/2019   - Refill Vitamin D, Ergocalciferol, (DRISDOL) 1.25 MG  (50000 UNIT) CAPS capsule; Take 1 capsule (50,000 Units total) by mouth every 7 (seven) days.  Dispense: 12 capsule; Refill: 0  3. Binge eating disorder Torra is taking Vyvanse 40 mg daily for BED.  Plan:  Continue Vyvanse 40 mg daily.  Will refill for 3 months today.  The goals for treatment of BED are to reduce eating binges and to achieve healthy eating habits. Because binge eating can correlate with negative emotions, treatment may also address any other mental-health issues, such as depression. People who binge eat feel as if they don't have control over how much they eat and have feelings of guilt or self-loathing after a binge eating episode.  The FDA has approved Vyvanse as a treatment option for binge eating disorder. Vyvanse targets the brain's reward center by increasing the levels of dopamine and norepinephrine, the chemicals of the brain responsible for feelings of pleasure.  Mindful eating is the recommended nutritional approach to treating BED.   - Refill Lisdexamfetamine Dimesylate (VYVANSE) 40 MG CHEW; Chew 40 mg by mouth daily.  Dispense: 30 tablet; Refill: 0 - Lisdexamfetamine Dimesylate (VYVANSE) 40 MG CHEW; Chew 40 mg by mouth daily at 12 noon.  Dispense: 30 tablet; Refill: 0 - Lisdexamfetamine Dimesylate (VYVANSE) 40 MG CHEW; Chew 40 mg by mouth daily at 12 noon.  Dispense: 30 tablet; Refill: 0  4. Obesity, current BMI 30.5  Course: Jody [name] is currently in the action stage of change. As such, her goal is to continue with weight loss efforts.   Nutrition goals: She has agreed to keeping a  food journal and adhering to recommended goals of 1000-1200 calories and 85 grams of protein.   Exercise goals:  As is.  Behavioral modification strategies: increasing lean protein intake, decreasing simple carbohydrates, increasing vegetables, and increasing water intake.  Shelly has agreed to follow-up with our clinic in 3 months. She was informed of the importance of frequent  follow-up visits to maximize her success with intensive lifestyle modifications for her multiple health conditions.   Objective:   Blood pressure 128/82, pulse (!) 109, temperature 98.3 F (36.8 C), temperature source Oral, height 5\' 9"  (1.753 m), weight 206 lb (93.4 kg), SpO2 98 %. Body mass index is 30.42 kg/m.  General: Cooperative, alert, well developed, in no acute distress. HEENT: Conjunctivae and lids unremarkable. Cardiovascular: Regular rhythm.  Lungs: Normal work of breathing. Neurologic: No focal deficits.   Lab Results  Component Value Date   CREATININE 0.80 03/03/2021   BUN 12 03/03/2021   NA 141 03/03/2021   K 4.5 03/03/2021   CL 107 (H) 03/03/2021   CO2 21 03/03/2021   Lab Results  Component Value Date   ALT 13 03/03/2021   AST 10 03/03/2021   ALKPHOS 73 03/03/2021   BILITOT <0.2 03/03/2021   Lab Results  Component Value Date   HGBA1C 6.1 (H) 01/05/2021   HGBA1C 6.0 (H) 07/09/2020   HGBA1C 5.6 12/05/2019   HGBA1C 5.8 (H) 07/05/2019   HGBA1C 5.9 (H) 11/02/2018   Lab Results  Component Value Date   INSULIN 27.5 (H) 07/09/2020   INSULIN 20.4 02/12/2020   INSULIN 28.4 (H) 12/05/2019   Lab Results  Component Value Date   TSH 0.956 03/03/2021   Lab Results  Component Value Date   CHOL 213 (H) 03/03/2021   HDL 72 03/03/2021   LDLCALC 123 (H) 03/03/2021   TRIG 101 03/03/2021   CHOLHDL 3.0 03/03/2021   Lab Results  Component Value Date   VD25OH 14.9 (L) 03/03/2021   VD25OH 38.3 07/09/2020   VD25OH 22.2 (L) 07/05/2019   Lab Results  Component Value Date   WBC 6.0 03/03/2021   HGB 11.8 03/03/2021   HCT 36.5 03/03/2021   MCV 79 03/03/2021   PLT 371 03/03/2021   Lab Results  Component Value Date   IRON 44 03/03/2021   TIBC 350 03/03/2021   FERRITIN 52 03/03/2021   Attestation Statements:   Reviewed by clinician on day of visit: allergies, medications, problem list, medical history, surgical history, family history, social history, and  previous encounter notes.  I, Water quality scientist, CMA, am acting as transcriptionist for Briscoe Deutscher, DO  I have reviewed the above documentation for accuracy and completeness, and I agree with the above. -  Briscoe Deutscher, DO, MS, FAAFP, DABOM - Family and Bariatric Medicine.

## 2021-10-16 ENCOUNTER — Other Ambulatory Visit (INDEPENDENT_AMBULATORY_CARE_PROVIDER_SITE_OTHER): Payer: Self-pay | Admitting: Family Medicine

## 2021-10-16 DIAGNOSIS — R7301 Impaired fasting glucose: Secondary | ICD-10-CM

## 2021-10-20 ENCOUNTER — Telehealth: Payer: Self-pay

## 2021-10-20 ENCOUNTER — Ambulatory Visit (INDEPENDENT_AMBULATORY_CARE_PROVIDER_SITE_OTHER): Payer: BC Managed Care – PPO | Admitting: Nurse Practitioner

## 2021-10-20 ENCOUNTER — Other Ambulatory Visit: Payer: Self-pay

## 2021-10-20 ENCOUNTER — Encounter: Payer: Self-pay | Admitting: Nurse Practitioner

## 2021-10-20 VITALS — BP 130/88 | HR 88 | Temp 98.1°F | Ht 69.0 in | Wt 207.0 lb

## 2021-10-20 DIAGNOSIS — R319 Hematuria, unspecified: Secondary | ICD-10-CM | POA: Diagnosis not present

## 2021-10-20 DIAGNOSIS — R7303 Prediabetes: Secondary | ICD-10-CM

## 2021-10-20 DIAGNOSIS — I10 Essential (primary) hypertension: Secondary | ICD-10-CM

## 2021-10-20 DIAGNOSIS — Z0000 Encounter for general adult medical examination without abnormal findings: Secondary | ICD-10-CM

## 2021-10-20 DIAGNOSIS — E6609 Other obesity due to excess calories: Secondary | ICD-10-CM

## 2021-10-20 DIAGNOSIS — Z6836 Body mass index (BMI) 36.0-36.9, adult: Secondary | ICD-10-CM

## 2021-10-20 DIAGNOSIS — N390 Urinary tract infection, site not specified: Secondary | ICD-10-CM

## 2021-10-20 DIAGNOSIS — E559 Vitamin D deficiency, unspecified: Secondary | ICD-10-CM

## 2021-10-20 LAB — POCT URINALYSIS DIPSTICK
Bilirubin, UA: NEGATIVE
Glucose, UA: NEGATIVE
Ketones, UA: NEGATIVE
Leukocytes, UA: NEGATIVE
Nitrite, UA: POSITIVE
Protein, UA: POSITIVE — AB
Spec Grav, UA: 1.025 (ref 1.010–1.025)
Urobilinogen, UA: 0.2 E.U./dL
pH, UA: 5.5 (ref 5.0–8.0)

## 2021-10-20 MED ORDER — NITROFURANTOIN MONOHYD MACRO 100 MG PO CAPS: 100.0000 mg | ORAL_CAPSULE | Freq: Two times a day (BID) | ORAL | 0 refills | Status: AC

## 2021-10-20 NOTE — Telephone Encounter (Signed)
Pt notified of UTI. Pt aware, med sent to local designated pharmacy.

## 2021-10-20 NOTE — Progress Notes (Signed)
I,Victoria T Hamilton,acting as a Education administrator for Minette Brine, FNP.,have documented all relevant documentation on the behalf of Minette Brine, FNP,as directed by  Minette Brine, FNP while in the presence of Minette Brine, [place].  This visit occurred during the SARS-CoV-2 public health emergency.  Safety protocols were in place, including screening questions prior to the visit, additional usage of staff PPE, and extensive cleaning of exam room while observing appropriate contact time as indicated for disinfecting solutions.  Subjective:     Patient ID: Jody [name] , female    DOB: 11/02/1970 , 51 y.o.   MRN: [id number]   Chief Complaint  Patient presents with   Annual Exam    HPI  Pt presents for HM. She has been to Dr. Juleen China at the weight loss clinic. She is on Vyvanse and Ozempic.  She has been on the Vyvanse  Wt Readings from Last 3 Encounters: 10/20/21 : 207 lb (93.9 kg) 09/16/21 : 206 lb (93.4 kg) 08/05/21 : 210 lb 9.6 oz (95.5 kg)      Past Medical History:  Diagnosis Date   H/O amenorrhea    History of chicken pox    Hx of mumps    Hyperlipidemia    Hypertension    Low iron    Prediabetes    Yeast infection      Family History  Problem Relation Age of Onset   Hypertension Mother    Diabetes Mother    Hypertension Maternal Grandmother      Current Outpatient Medications:    Lisdexamfetamine Dimesylate (VYVANSE) 40 MG CHEW, Chew 40 mg by mouth daily., Disp: 30 tablet, Rfl: 0   lisinopril-hydrochlorothiazide (ZESTORETIC) 20-12.5 MG tablet, TAKE 1 TABLET BY MOUTH EVERY DAY, Disp: 90 tablet, Rfl: 2   nitrofurantoin, macrocrystal-monohydrate, (MACROBID) 100 MG capsule, Take 1 capsule (100 mg total) by mouth 2 (two) times daily for 5 days., Disp: 10 capsule, Rfl: 0   Semaglutide, 1 MG/DOSE, (OZEMPIC, 1 MG/DOSE,) 4 MG/3ML SOPN, Inject 1 mg into the skin once a week., Disp: 9 mL, Rfl: 0   Vitamin D, Ergocalciferol, (DRISDOL) 1.25 MG (50000 UNIT) CAPS capsule, Take 1  capsule (50,000 Units total) by mouth every 7 (seven) days., Disp: 12 capsule, Rfl: 0   No Known Allergies    The patient states she uses tubal ligation for birth control. Patient's last menstrual period was 10/15/2021 (exact date).. Negative for Dysmenorrhea and Negative for Menorrhagia. Negative for: breast discharge, breast lump(s), breast pain and breast self exam. Associated symptoms include abnormal vaginal bleeding. Pertinent negatives include abnormal bleeding (hematology), anxiety, decreased libido, depression, difficulty falling sleep, dyspareunia, history of infertility, nocturia, sexual dysfunction, sleep disturbances, urinary incontinence, urinary urgency, vaginal discharge and vaginal itching. Diet regular; diet has improved. She can tell a difference in her binge eating. The patient states her exercise level is minimal.   The patient's tobacco use is:  Social History   Tobacco Use  Smoking Status Never  Smokeless Tobacco Never   She has been exposed to passive smoke. The patient's alcohol use is:  Social History   Substance and Sexual Activity  Alcohol Use Yes   Alcohol/week: 1.0 standard drink   Types: 1 Standard drinks or equivalent per week   Additional information: Last pap 10/30/2018, next one scheduled for 10/30/2021.    Review of Systems  Constitutional: Negative.   HENT: Negative.    Eyes: Negative.   Respiratory: Negative.    Cardiovascular: Negative.   Gastrointestinal: Negative.   Endocrine:  Genitourinary: Negative.   °Musculoskeletal: Negative.   °Skin: Negative.   °Allergic/Immunologic: Negative.   °Neurological: Negative.   °Hematological: Negative.   °Psychiatric/Behavioral: Negative.     ° °Today's Vitals  ° 10/20/21 1209  °BP: 130/88  °Pulse: 88  °Temp: 98.1 °F (36.7 °C)  °Weight: 207 lb (93.9 kg)  °Height: 5' 9" (1.753 m)  ° °Body mass index is 30.57 kg/m².  °Wt Readings from Last 3 Encounters:  °10/20/21 207 lb (93.9 kg)  °09/16/21 206 lb  (93.4 kg)  °08/05/21 210 lb 9.6 oz (95.5 kg)  °  °Objective:  °Physical Exam °Constitutional:   °   General: She is not in acute distress. °   Appearance: Normal appearance. She is well-developed. She is obese.  °HENT:  °   Head: Normocephalic and atraumatic.  °   Right Ear: Hearing, tympanic membrane, ear canal and external ear normal. There is no impacted cerumen.  °   Left Ear: Hearing, tympanic membrane, ear canal and external ear normal. There is no impacted cerumen.  °   Nose:  °   Comments: Deferred - masked °   Mouth/Throat:  °   Comments: Deferred - masked °Eyes:  °   General: Lids are normal.  °   Extraocular Movements: Extraocular movements intact.  °   Conjunctiva/sclera: Conjunctivae normal.  °   Pupils: Pupils are equal, round, and reactive to light.  °   Funduscopic exam: °   Right eye: No papilledema.     °   Left eye: No papilledema.  °Neck:  °   Thyroid: No thyroid mass.  °   Vascular: No carotid bruit.  °Cardiovascular:  °   Rate and Rhythm: Normal rate and regular rhythm.  °   Pulses: Normal pulses.  °   Heart sounds: Normal heart sounds. No murmur heard. °Pulmonary:  °   Effort: Pulmonary effort is normal. No respiratory distress.  °   Breath sounds: Normal breath sounds. No wheezing.  °Abdominal:  °   General: Abdomen is flat. Bowel sounds are normal.  °   Palpations: Abdomen is soft.  °Musculoskeletal:     °   General: No swelling. Normal range of motion.  °   Cervical back: Full passive range of motion without pain, normal range of motion and neck supple.  °   Right lower leg: No edema.  °   Left lower leg: No edema.  °Skin: °   General: Skin is warm and dry.  °   Capillary Refill: Capillary refill takes less than 2 seconds.  °Neurological:  °   General: No focal deficit present.  °   Mental Status: She is alert and oriented to person, place, and time.  °   Cranial Nerves: No cranial nerve deficit.  °   Sensory: No sensory deficit.  °Psychiatric:     °   Mood and Affect: Mood normal.     °    Behavior: Behavior normal.     °   Thought Content: Thought content normal.     °   Judgment: Judgment normal.  °  ° °   °Assessment And Plan:  °   °1. Encounter for general adult medical examination w/o abnormal findings °Behavior modifications discussed and diet history reviewed.   °Pt will continue to exercise regularly and modify diet with low GI, plant based foods and decrease intake of processed foods.  °Recommend intake of daily multivitamin, Vitamin D, and calcium.  °Recommend mammogram   Recommend mammogram and colonoscopy for preventive screenings, as well as recommend immunizations that include influenza, TDAP (up to date)  2. Class 2 obesity due to excess calories with body mass index (BMI) of 36.0 to 36.9 in adult, unspecified whether serious comorbidity present Chronic Discussed healthy diet and regular exercise options  Encouraged to exercise at least 150 minutes per week with 2 days of strength training  3. Prediabetes Comments: Continue Ozempic tolerating well. She is being followed by Weight Management - POCT Urinalysis Dipstick (81002) - CMP14+EGFR - Hemoglobin A1c  4. Essential hypertension, benign Comments: Blood pressure is fairly controlled, continue current medications - POCT Urinalysis Dipstick (81002) - EKG 12-Lead - CMP14+EGFR - CBC - Hemoglobin A1c - Lipid panel - Microalbumin / Creatinine Urine Ratio  5. Vitamin D deficiency Will check vitamin D level and supplement as needed.    Also encouraged to spend 15 minutes in the sun daily.  - VITAMIN D 25 Hydroxy (Vit-D Deficiency, Fractures)  6. Urinary tract infection with hematuria, site unspecified Urinalysis is positive for nitrates and moderate blood.  Will treat with nitrofuratoin and send urine for culture. - Culture, Urine - nitrofurantoin, macrocrystal-monohydrate, (MACROBID) 100 MG capsule; Take 1 capsule (100 mg total) by mouth 2 (two) times daily for 5 days.  Dispense: 10 capsule; Refill: 0    Patient was given  opportunity to ask questions. Patient verbalized understanding of the plan and was able to repeat key elements of the plan. All questions were answered to their satisfaction.   Minette Brine, FNP   I, Minette Brine, FNP, have reviewed all documentation for this visit. The documentation on 10/20/21 for the exam, diagnosis, procedures, and orders are all accurate and complete.  THE PATIENT IS ENCOURAGED TO PRACTICE SOCIAL DISTANCING DUE TO THE COVID-19 PANDEMIC.

## 2021-10-20 NOTE — Patient Instructions (Signed)

## 2021-10-21 LAB — CBC
Hematocrit: 34 % (ref 34.0–46.6)
Hemoglobin: 11 g/dL — ABNORMAL LOW (ref 11.1–15.9)
MCH: 25 pg — ABNORMAL LOW (ref 26.6–33.0)
MCHC: 32.4 g/dL (ref 31.5–35.7)
MCV: 77 fL — ABNORMAL LOW (ref 79–97)
Platelets: 378 10*3/uL (ref 150–450)
RBC: 4.4 x10E6/uL (ref 3.77–5.28)
RDW: 13.4 % (ref 11.7–15.4)
WBC: 6.5 10*3/uL (ref 3.4–10.8)

## 2021-10-21 LAB — LIPID PANEL
Chol/HDL Ratio: 3 ratio (ref 0.0–4.4)
Cholesterol, Total: 215 mg/dL — ABNORMAL HIGH (ref 100–199)
HDL: 71 mg/dL (ref >39)
LDL Chol Calc (NIH): 132 mg/dL — ABNORMAL HIGH (ref 0–99)
Triglycerides: 66 mg/dL (ref 0–149)
VLDL Cholesterol Cal: 12 mg/dL (ref 5–40)

## 2021-10-21 LAB — CMP14+EGFR
ALT: 9 IU/L (ref 0–32)
AST: 8 IU/L (ref 0–40)
Albumin/Globulin Ratio: 1.6 (ref 1.2–2.2)
Albumin: 4.6 g/dL (ref 3.8–4.8)
Alkaline Phosphatase: 77 IU/L (ref 44–121)
BUN/Creatinine Ratio: 13 (ref 9–23)
BUN: 11 mg/dL (ref 6–24)
Bilirubin Total: <0.2 mg/dL (ref 0.0–1.2)
CO2: 23 mmol/L (ref 20–29)
Calcium: 9.5 mg/dL (ref 8.7–10.2)
Chloride: 103 mmol/L (ref 96–106)
Creatinine, Ser: 0.86 mg/dL (ref 0.57–1.00)
Globulin, Total: 2.9 g/dL (ref 1.5–4.5)
Glucose: 90 mg/dL (ref 70–99)
Potassium: 4.3 mmol/L (ref 3.5–5.2)
Sodium: 141 mmol/L (ref 134–144)
Total Protein: 7.5 g/dL (ref 6.0–8.5)
eGFR: 83 mL/min/{1.73_m2} (ref >59)

## 2021-10-21 LAB — HEMOGLOBIN A1C
Est. average glucose Bld gHb Est-mCnc: 120 mg/dL
Hgb A1c MFr Bld: 5.8 % — ABNORMAL HIGH (ref 4.8–5.6)

## 2021-10-21 LAB — VITAMIN D 25 HYDROXY (VIT D DEFICIENCY, FRACTURES): Vit D, 25-Hydroxy: 44.6 ng/mL (ref 30.0–100.0)

## 2021-10-21 LAB — MICROALBUMIN / CREATININE URINE RATIO
Creatinine, Urine: 153.2 mg/dL
Microalb/Creat Ratio: 71 mg/g creat — ABNORMAL HIGH (ref 0–29)
Microalbumin, Urine: 108.8 ug/mL

## 2021-10-22 LAB — URINE CULTURE

## 2021-10-29 ENCOUNTER — Other Ambulatory Visit (INDEPENDENT_AMBULATORY_CARE_PROVIDER_SITE_OTHER): Payer: Self-pay | Admitting: Family Medicine

## 2021-10-29 DIAGNOSIS — R7301 Impaired fasting glucose: Secondary | ICD-10-CM

## 2021-10-29 NOTE — Telephone Encounter (Signed)
Pt last seen by Dr. Wallace.  

## 2021-12-03 ENCOUNTER — Other Ambulatory Visit (INDEPENDENT_AMBULATORY_CARE_PROVIDER_SITE_OTHER): Payer: Self-pay | Admitting: Family Medicine

## 2021-12-03 DIAGNOSIS — F5081 Binge eating disorder: Secondary | ICD-10-CM

## 2021-12-04 MED ORDER — VYVANSE 40 MG PO CHEW: 40.0000 mg | CHEWABLE_TABLET | Freq: Every day | ORAL | 0 refills | Status: DC

## 2021-12-15 ENCOUNTER — Ambulatory Visit (INDEPENDENT_AMBULATORY_CARE_PROVIDER_SITE_OTHER): Payer: BC Managed Care – PPO | Admitting: Family Medicine

## 2021-12-15 ENCOUNTER — Encounter (INDEPENDENT_AMBULATORY_CARE_PROVIDER_SITE_OTHER): Payer: Self-pay | Admitting: Family Medicine

## 2021-12-15 VITALS — BP 122/79 | HR 73 | Temp 97.8°F | Ht 69.0 in | Wt 206.0 lb

## 2021-12-15 DIAGNOSIS — D509 Iron deficiency anemia, unspecified: Secondary | ICD-10-CM

## 2021-12-15 DIAGNOSIS — Z6830 Body mass index (BMI) 30.0-30.9, adult: Secondary | ICD-10-CM

## 2021-12-15 DIAGNOSIS — F5081 Binge eating disorder: Secondary | ICD-10-CM | POA: Diagnosis not present

## 2021-12-15 DIAGNOSIS — R7303 Prediabetes: Secondary | ICD-10-CM

## 2021-12-15 DIAGNOSIS — E7800 Pure hypercholesterolemia, unspecified: Secondary | ICD-10-CM

## 2021-12-15 DIAGNOSIS — E669 Obesity, unspecified: Secondary | ICD-10-CM

## 2021-12-15 MED ORDER — VYVANSE 50 MG PO CHEW: 50.0000 mg | CHEWABLE_TABLET | ORAL | 0 refills | Status: DC

## 2021-12-15 MED ORDER — VYVANSE 60 MG PO CHEW: 60.0000 mg | CHEWABLE_TABLET | ORAL | 0 refills | Status: DC

## 2021-12-21 NOTE — Progress Notes (Signed)
Chief Complaint:   OBESITY Jody [name] is here to discuss her progress with her obesity treatment plan along with follow-up of her obesity related diagnoses. See Medical Weight Management Flowsheet for complete bioelectrical impedance results.  Today's visit was #: 8 Starting weight: 233 lbs Starting date: 03/03/2021 Weight change since last visit: 0 Total lbs lost to date: 27 lbs Total weight loss percentage to date: -11.59%  Nutrition Plan: Keeping a food journal and adhering to recommended goals of 1000-1200 calories and 85 grams of protein daily for 75% of the time. Activity: Increased walking. Anti-obesity medications: Ozempic 1 mg subcutaneously weekly. Reported side effects: None.  Interim History: Jody [name] says she has noticed less ability to focus.  She has noticed some fatigue as well.  We reviewed her previous labs (PCP) together.  She has microcytic anemia.  Endorses heavy menses.  Assessment/Plan:   1. Microcytic anemia Recommend iron supplement every other day.  Also recommend visit with GYN. We will continue to monitor symptoms as they relate to her weight loss journey.  2. Prediabetes Not at goal. Goal is HgbA1c < 5.7.  Medication: Ozempic 1 mg subcutaneously weekly.    Plan: She will continue to focus on protein-rich, low simple carbohydrate foods. We reviewed the importance of hydration, regular exercise for stress reduction, and restorative sleep.   Lab Results  Component Value Date   HGBA1C 5.8 (H) 10/20/2021   Lab Results  Component Value Date   INSULIN 27.5 (H) 07/09/2020   INSULIN 20.4 02/12/2020   INSULIN 28.4 (H) 12/05/2019   3. Pure hypercholesterolemia Course: Not at goal. Lipid-lowering medications: None.   Plan: Dietary changes: Increase soluble fiber, decrease simple carbohydrates, decrease saturated fat. Exercise changes: Moderate to vigorous-intensity aerobic activity 150 minutes per week or as tolerated. We will continue to monitor along  with PCP as it pertains to her weight loss journey.  Lab Results  Component Value Date   CHOL 215 (H) 10/20/2021   HDL 71 10/20/2021   LDLCALC 132 (H) 10/20/2021   TRIG 66 10/20/2021   CHOLHDL 3.0 10/20/2021   Lab Results  Component Value Date   ALT 9 10/20/2021   AST 8 10/20/2021   ALKPHOS 77 10/20/2021   BILITOT <0.2 10/20/2021   The 10-year ASCVD risk score (Arnett DK, et al., 2019) is: 3.8%   Values used to calculate the score:     Age: 51 years     Sex: Female     Is Non-Hispanic African American: Yes     Diabetic: No     Tobacco smoker: Yes     Systolic Blood Pressure: 122 mmHg     Is BP treated: Yes     HDL Cholesterol: 71 mg/dL     Total Cholesterol: 215 mg/dL  4. Binge eating disorder She takes Vyvanse 40 mg at 7:30 with no crash.  The goals for treatment of BED are to reduce eating binges and to achieve healthy eating habits. Because binge eating can correlate with negative emotions, treatment may also address any other mental-health issues, such as depression.  People who binge eat feel as if they don't have control over how much they eat and have feelings of guilt or self-loathing after a binge eating episode.  The FDA has approved Vyvanse as a treatment option for binge eating disorder. Vyvanse targets the brain's reward center by increasing the levels of dopamine and norepinephrine, the chemicals of the brain responsible for feelings of pleasure.  Mindful eating is the  recommended nutritional approach to treating BED.   - Increase Lisdexamfetamine Dimesylate (VYVANSE) 50 MG CHEW; Chew 50 mg by mouth every morning.  Dispense: 30 tablet; Refill: 0 - Increase Lisdexamfetamine Dimesylate (VYVANSE) 60 MG CHEW; Chew 60 mg by mouth every morning.  Dispense: 30 tablet; Refill: 0  5. Obesity, current BMI 30.5  Course: Jody [name] is currently in the action stage of change. As such, her goal is to continue with weight loss efforts.   Nutrition goals: She has agreed to  keeping a food journal and adhering to recommended goals of 1000-1200 calories and 85 grams of protein.   Exercise goals:  As is.  Behavioral modification strategies: increasing lean protein intake, decreasing simple carbohydrates, and increasing vegetables.  Jody [name] has agreed to follow-up with our clinic in 8 weeks. She was informed of the importance of frequent follow-up visits to maximize her success with intensive lifestyle modifications for her multiple health conditions.   Objective:   Blood pressure 122/79, pulse 73, temperature 97.8 F (36.6 C), temperature source Oral, height 5\' 9"  (1.753 m), weight 206 lb (93.4 kg), SpO2 99 %. Body mass index is 30.42 kg/m.  General: Cooperative, alert, well developed, in no acute distress. HEENT: Conjunctivae and lids unremarkable. Cardiovascular: Regular rhythm.  Lungs: Normal work of breathing. Neurologic: No focal deficits.   Lab Results  Component Value Date   CREATININE 0.86 10/20/2021   BUN 11 10/20/2021   NA 141 10/20/2021   K 4.3 10/20/2021   CL 103 10/20/2021   CO2 23 10/20/2021   Lab Results  Component Value Date   ALT 9 10/20/2021   AST 8 10/20/2021   ALKPHOS 77 10/20/2021   BILITOT <0.2 10/20/2021   Lab Results  Component Value Date   HGBA1C 5.8 (H) 10/20/2021   HGBA1C 6.1 (H) 01/05/2021   HGBA1C 6.0 (H) 07/09/2020   HGBA1C 5.6 12/05/2019   HGBA1C 5.8 (H) 07/05/2019   Lab Results  Component Value Date   INSULIN 27.5 (H) 07/09/2020   INSULIN 20.4 02/12/2020   INSULIN 28.4 (H) 12/05/2019   Lab Results  Component Value Date   TSH 0.956 03/03/2021   Lab Results  Component Value Date   CHOL 215 (H) 10/20/2021   HDL 71 10/20/2021   LDLCALC 132 (H) 10/20/2021   TRIG 66 10/20/2021   CHOLHDL 3.0 10/20/2021   Lab Results  Component Value Date   VD25OH 44.6 10/20/2021   VD25OH 14.9 (L) 03/03/2021   VD25OH 38.3 07/09/2020   Lab Results  Component Value Date   WBC 6.5 10/20/2021   HGB 11.0 (L)  10/20/2021   HCT 34.0 10/20/2021   MCV 77 (L) 10/20/2021   PLT 378 10/20/2021   Lab Results  Component Value Date   IRON 44 03/03/2021   TIBC 350 03/03/2021   FERRITIN 52 03/03/2021   Attestation Statements:   Reviewed by clinician on day of visit: allergies, medications, problem list, medical history, surgical history, family history, social history, and previous encounter notes.  I, 03/05/2021, CMA, am acting as transcriptionist for Insurance claims handler, DO  I have reviewed the above documentation for accuracy and completeness, and I agree with the above. -  Helane Rima, DO, MS, FAAFP, DABOM - Family and Bariatric Medicine.

## 2021-12-29 ENCOUNTER — Encounter: Payer: Self-pay | Admitting: Nurse Practitioner

## 2021-12-31 ENCOUNTER — Encounter: Payer: Self-pay | Admitting: Internal Medicine

## 2021-12-31 ENCOUNTER — Ambulatory Visit: Payer: BC Managed Care – PPO | Admitting: Internal Medicine

## 2021-12-31 VITALS — BP 112/78 | HR 96 | Temp 98.3°F | Ht 68.8 in | Wt 207.4 lb

## 2021-12-31 DIAGNOSIS — R42 Dizziness and giddiness: Secondary | ICD-10-CM

## 2021-12-31 DIAGNOSIS — F5081 Binge eating disorder: Secondary | ICD-10-CM | POA: Diagnosis not present

## 2021-12-31 DIAGNOSIS — E8881 Metabolic syndrome: Secondary | ICD-10-CM | POA: Diagnosis not present

## 2021-12-31 DIAGNOSIS — L299 Pruritus, unspecified: Secondary | ICD-10-CM | POA: Diagnosis not present

## 2021-12-31 DIAGNOSIS — D500 Iron deficiency anemia secondary to blood loss (chronic): Secondary | ICD-10-CM

## 2021-12-31 DIAGNOSIS — Z6830 Body mass index (BMI) 30.0-30.9, adult: Secondary | ICD-10-CM

## 2021-12-31 DIAGNOSIS — E6609 Other obesity due to excess calories: Secondary | ICD-10-CM

## 2021-12-31 NOTE — Progress Notes (Signed)
?Rich Brave Llittleton,acting as a Education administrator for Maximino Greenland, MD.,have documented all relevant documentation on the behalf of Maximino Greenland, MD,as directed by  Maximino Greenland, MD while in the presence of Maximino Greenland, MD.  ?This visit occurred during the SARS-CoV-2 public health emergency.  Safety protocols were in place, including screening questions prior to the visit, additional usage of staff PPE, and extensive cleaning of exam room while observing appropriate contact time as indicated for disinfecting solutions. ? ?Subjective:  ?  ? Patient ID: Jody [name] , female    DOB: 04-03-1971 , 51 y.o.   MRN: [id number] ? ? ?Chief Complaint  ?Patient presents with  ? itching   ? ? ?HPI ? ?Patient presents today for itchy hands and feet. Patient reports she does not have a rash. She has been itching for a week and a half. Patient reports the itching always happens at night itll start with her hands and go to her feet. Pt reports she used anti itch cream and had no relief. Patient stated she has taken some benadryl and her sx improved. Patient reports it happens at the same time every night around 930pm.  ? ?Patient reports she also has been having a headache in the back of her head and she feels lightheaded and unbalanced. She reports she is fine in the mornings and traveling to work.  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 She reports she feels fine right now though.  ?  ? ?Past Medical History:  ?Diagnosis Date  ? H/O amenorrhea   ? History of chicken pox   ? Hx of mumps   ? Hyperlipidemia   ? Hypertension   ? Low iron   ? Prediabetes   ? Yeast infection   ?  ? ?Family History  ?Problem Relation Age of Onset  ? Hypertension Mother   ? Diabetes Mother   ? Hypertension Maternal Grandmother   ? ? ? ?Current Outpatient Medications:  ?  Lisdexamfetamine Dimesylate (VYVANSE) 40 MG CHEW, Chew 40 mg by mouth daily., Disp: 30 tablet, Rfl: 0 ?  Lisdexamfetamine  Dimesylate (VYVANSE) 50 MG CHEW, Chew 50 mg by mouth every morning., Disp: 30 tablet, Rfl: 0 ?  Lisdexamfetamine Dimesylate (VYVANSE) 60 MG CHEW, Chew 60 mg by mouth every morning., Disp: 30 tablet, Rfl: 0 ?  lisinopril-hydrochlorothiazide (ZESTORETIC) 20-12.5 MG tablet, TAKE 1 TABLET BY MOUTH EVERY DAY, Disp: 90 tablet, Rfl: 2 ?  Semaglutide, 1 MG/DOSE, (OZEMPIC, 1 MG/DOSE,) 4 MG/3ML SOPN, Inject 1 mg into the skin once a week., Disp: 9 mL, Rfl: 0 ?  Vitamin D, Ergocalciferol, (DRISDOL) 1.25 MG (50000 UNIT) CAPS capsule, Take 1 capsule (50,000 Units total) by mouth every 7 (seven) days., Disp: 12 capsule, Rfl: 0  ? ?No Known Allergies  ? ?Review of Systems  ?Constitutional: Negative.   ?Respiratory: Negative.    ?Cardiovascular: Negative.   ?Gastrointestinal: Negative.   ?Skin:   ?     She c/o itchy hands.   ?Neurological: Negative.   ?Psychiatric/Behavioral: Negative.     ? ?Today's Vitals  ? 12/31/21 0913 12/31/21 [id number] 12/31/21 0919 12/31/21 0920  ?BP: 112/70 112/72 114/76 112/78  ?Pulse: 76 72 95 96  ?Temp: 98.3 ?F (36.8 ?C)     ?Weight: 207 lb 6.4 oz (94.1 kg)     ?Height: 5' 8.8" (1.748 m)     ?PainSc: 3      ?PainLoc: Head     ? ?  Body mass index is 30.81 kg/m?.  ?Wt Readings from Last 3 Encounters:  ?12/31/21 207 lb 6.4 oz (94.1 kg)  ?12/15/21 206 lb (93.4 kg)  ?10/20/21 207 lb (93.9 kg)  ?  ? ?Objective:  ?Physical Exam ?Vitals and nursing note reviewed.  ?Constitutional:   ?   Appearance: Normal appearance.  ?HENT:  ?   Head: Normocephalic and atraumatic.  ?   Right Ear: Tympanic membrane, ear canal and external ear normal. There is no impacted cerumen.  ?   Left Ear: Tympanic membrane, ear canal and external ear normal. There is no impacted cerumen.  ?Eyes:  ?   Extraocular Movements: Extraocular movements intact.  ?Cardiovascular:  ?   Rate and Rhythm: Normal rate and regular rhythm.  ?   Heart sounds: Normal heart sounds.  ?Pulmonary:  ?   Effort: Pulmonary effort is normal.  ?   Breath sounds: Normal  breath sounds.  ?Musculoskeletal:  ?   Cervical back: Normal range of motion.  ?Skin: ?   General: Skin is warm.  ?   Findings: No rash.  ?   Comments: No rash on hands  ?Neurological:  ?   General: No focal deficit present.  ?   Mental Status: She is alert.  ?Psychiatric:     ?   Mood and Affect: Mood normal.     ?   Behavior: Behavior normal.  ?  ? ?   ?Assessment And Plan:  ?   ?1. Itching of both hands ?Comments: I iwll check labs as below. She is encouraged to increase her water intake and increase her intake of healthy fats including walnuts, avocado and olive oil.  ?- Liver Profile ?- Allergens (22) Foods ?- ANA, IFA (with reflex) ?- CBC with Diff ? ?2. Insulin resistance ?Comments: Her a1c has been elevated in the past. I will recheck this today. She will c/w semaglutide. [facility]  ? ?3. Dizziness ?Comments: She is not orthostatic. She is encouraged to stay well hydrated. Possibly exacerbated by Vyvanse.  She will let me know if her sx persist.  ? ?4. Binge eating disorder ?Comments: Diagnosed by provider at [facility]. She plans to continue w/ Vyvanse, may benefit from drug holiday.  ? ?5. Iron deficiency anemia due to chronic blood loss ?Comments: Recent CBC results reviewed. I will check iron panel today.  ?- Iron, TIBC and Ferritin Panel ? ?6. Class 1 obesity due to excess calories with serious comorbidity and body mass index (BMI) of 30.0 to 30.9 in adult ?Comments: She has lost 27 lbs since June 2023. She was congratulated on her weight loss thus far and encouraged to keep up the great work! ?  ?Patient was given opportunity to ask questions. Patient verbalized understanding of the plan and was able to repeat key elements of the plan. All questions were answered to their satisfaction.  ? ?I, Maximino Greenland, MD, have reviewed all documentation for this visit. The documentation on 01/02/22 for the exam, diagnosis, procedures, and orders are all accurate and  complete.  ? ?IF YOU HAVE BEEN REFERRED TO A SPECIALIST, IT MAY TAKE 1-2 WEEKS TO SCHEDULE/PROCESS THE REFERRAL. IF YOU HAVE NOT HEARD FROM US/SPECIALIST IN TWO WEEKS, PLEASE GIVE [place] A CALL AT [phone].  ? ?THE PATIENT IS ENCOURAGED TO PRACTICE SOCIAL DISTANCING DUE TO THE COVID-19 PANDEMIC.   ?

## 2021-12-31 NOTE — Patient Instructions (Signed)
Pruritus Pruritus is an itchy feeling on the skin. One of the most common causes is dry skin, but many different things can cause itching. Most cases of itching do not require medical attention. Sometimes itchy skin can turn into a rash. Follow these instructions at home: Skin care  Apply moisturizing lotion to your skin as needed. Lotion that contains petroleum jelly is best. Take medicines or apply medicated creams only as told by your health care provider. This may include: Corticosteroid cream. Anti-itch lotions. Oral antihistamines. Apply a cool, wet cloth (cool compress) to the affected areas. Take baths with one of the following: Epsom salts. You can get these at your local pharmacy or grocery store. Follow the instructions on the packaging. Baking soda. Pour a small amount into the bath as told by your health care provider. Colloidal oatmeal. You can get this at your local pharmacy or grocery store. Follow the instructions on the packaging. Apply baking soda paste to your skin. To make the paste, stir water into a small amount of baking soda until it reaches a paste-like consistency. Do not scratch your skin. Do not take hot showers or baths, which can make itching worse. A cool shower may help with itching as long as you apply moisturizing lotion after the shower. Do not use scented soaps, detergents, perfumes, and cosmetic products. Instead, use gentle, unscented versions of these items. General instructions Avoid wearing tight clothes. Keep a journal to help find out what is causing your itching. Write down: What you eat and drink. What cosmetic products you use. What soaps or detergents you use. What you wear, including jewelry. Use a humidifier. This keeps the air moist, which helps to prevent dry skin. Be aware of any changes in your itchiness. Contact a health care provider if: The itching does not go away after several days. You are unusually thirsty or urinating more  than normal. Your skin tingles or feels numb. Your skin or the white parts of your eyes turn yellow (jaundice). You feel weak. You have any of the following: Night sweats. Tiredness (fatigue). Weight loss. Abdominal pain. Summary Pruritus is an itchy feeling on the skin. One of the most common causes is dry skin, but many different conditions and factors can cause itching. Apply moisturizing lotion to your skin as needed. Lotion that contains petroleum jelly is best. Take medicines or apply medicated creams only as told by your health care provider. Do not take hot showers or baths. Do not use scented soaps, detergents, perfumes, or cosmetic products. This information is not intended to replace advice given to you by your health care provider. Make sure you discuss any questions you have with your health care provider. Document Revised: 06/15/2021 Document Reviewed: 06/15/2021 Elsevier Patient Education  2022 Elsevier Inc.  

## 2022-01-04 ENCOUNTER — Encounter: Payer: Self-pay | Admitting: Internal Medicine

## 2022-01-04 LAB — CBC WITH DIFFERENTIAL/PLATELET
Basophils Absolute: 0 10*3/uL (ref 0.0–0.2)
Basos: 1 %
EOS (ABSOLUTE): 0.2 10*3/uL (ref 0.0–0.4)
Eos: 3 %
Hematocrit: 38.7 % (ref 34.0–46.6)
Hemoglobin: 12.3 g/dL (ref 11.1–15.9)
Immature Grans (Abs): 0 10*3/uL (ref 0.0–0.1)
Immature Granulocytes: 0 %
Lymphocytes Absolute: 3 10*3/uL (ref 0.7–3.1)
Lymphs: 45 %
MCH: 25 pg — ABNORMAL LOW (ref 26.6–33.0)
MCHC: 31.8 g/dL (ref 31.5–35.7)
MCV: 79 fL (ref 79–97)
Monocytes Absolute: 0.4 10*3/uL (ref 0.1–0.9)
Monocytes: 5 %
Neutrophils Absolute: 3.1 10*3/uL (ref 1.4–7.0)
Neutrophils: 46 %
Platelets: 433 10*3/uL (ref 150–450)
RBC: 4.92 x10E6/uL (ref 3.77–5.28)
RDW: 14.6 % (ref 11.7–15.4)
WBC: 6.8 10*3/uL (ref 3.4–10.8)

## 2022-01-04 LAB — HEPATIC FUNCTION PANEL
ALT: 13 IU/L (ref 0–32)
AST: 14 IU/L (ref 0–40)
Albumin: 4.5 g/dL (ref 3.8–4.8)
Alkaline Phosphatase: 79 IU/L (ref 44–121)
Bilirubin Total: <0.2 mg/dL (ref 0.0–1.2)
Bilirubin, Direct: <0.1 mg/dL (ref 0.00–0.40)
Total Protein: 7.5 g/dL (ref 6.0–8.5)

## 2022-01-04 LAB — ALLERGENS (22) FOODS IGG
Casein IgG: 5.5 ug/mL — ABNORMAL HIGH (ref 0.0–1.9)
Cheese, Mold Type IgG: 21.4 ug/mL — ABNORMAL HIGH (ref 0.0–1.9)
Chicken IgG: <2 ug/mL (ref 0.0–1.9)
Chili Pepper IgG: 10.3 ug/mL — ABNORMAL HIGH (ref 0.0–1.9)
Chocolate/Cacao IgG: 4 ug/mL — ABNORMAL HIGH (ref 0.0–1.9)
Coffee IgG: 6.9 ug/mL — ABNORMAL HIGH (ref 0.0–1.9)
Corn IgG: 4.9 ug/mL — ABNORMAL HIGH (ref 0.0–1.9)
Egg, Whole IgG: 4.9 ug/mL — ABNORMAL HIGH (ref 0.0–1.9)
Green Bean IgG: 4.4 ug/mL — ABNORMAL HIGH (ref 0.0–1.9)
Haddock IgG: <2 ug/mL (ref 0.0–1.9)
Lamb IgG: 3.6 ug/mL — ABNORMAL HIGH (ref 0.0–1.9)
Oat IgG: 12.5 ug/mL — ABNORMAL HIGH (ref 0.0–1.9)
Onion IgG: 4.8 ug/mL — ABNORMAL HIGH (ref 0.0–1.9)
Peanut IgG: 2.5 ug/mL — ABNORMAL HIGH (ref 0.0–1.9)
Pork IgG: 4.2 ug/mL — ABNORMAL HIGH (ref 0.0–1.9)
Potato, White, IgG: 2.5 ug/mL — ABNORMAL HIGH (ref 0.0–1.9)
Rye IgG: 7.8 ug/mL — ABNORMAL HIGH (ref 0.0–1.9)
Shrimp IgG: 4.2 ug/mL — ABNORMAL HIGH (ref 0.0–1.9)
Soybean IgG: 2.8 ug/mL — ABNORMAL HIGH (ref 0.0–1.9)
Tomato IgG: 3.2 ug/mL — ABNORMAL HIGH (ref 0.0–1.9)
Wheat IgG: 4.5 ug/mL — ABNORMAL HIGH (ref 0.0–1.9)
Yeast IgG: 3.4 ug/mL — ABNORMAL HIGH (ref 0.0–1.9)

## 2022-01-04 LAB — ANTINUCLEAR ANTIBODIES, IFA: ANA Titer 1: NEGATIVE

## 2022-01-04 LAB — IRON,TIBC AND FERRITIN PANEL
Ferritin: 55 ng/mL (ref 15–150)
Iron Saturation: 23 % (ref 15–55)
Iron: 88 ug/dL (ref 27–159)
Total Iron Binding Capacity: 377 ug/dL (ref 250–450)
UIBC: 289 ug/dL (ref 131–425)

## 2022-01-12 ENCOUNTER — Encounter: Payer: Self-pay | Admitting: Internal Medicine

## 2022-01-14 ENCOUNTER — Other Ambulatory Visit: Payer: Self-pay | Admitting: Internal Medicine

## 2022-01-14 DIAGNOSIS — L299 Pruritus, unspecified: Secondary | ICD-10-CM

## 2022-01-26 ENCOUNTER — Encounter: Payer: Self-pay | Admitting: Allergy & Immunology

## 2022-01-26 ENCOUNTER — Ambulatory Visit: Payer: BC Managed Care – PPO | Admitting: Allergy & Immunology

## 2022-01-26 VITALS — BP 124/74 | HR 75 | Temp 98.1°F | Resp 16 | Ht 68.56 in | Wt 209.6 lb

## 2022-01-26 DIAGNOSIS — T781XXA Other adverse food reactions, not elsewhere classified, initial encounter: Secondary | ICD-10-CM

## 2022-01-26 DIAGNOSIS — L299 Pruritus, unspecified: Secondary | ICD-10-CM

## 2022-01-26 DIAGNOSIS — K9049 Malabsorption due to intolerance, not elsewhere classified: Secondary | ICD-10-CM

## 2022-01-26 MED ORDER — EPICERAM EX EMUL: 1.0000 "application " | Freq: Two times a day (BID) | CUTANEOUS | 5 refills | Status: DC

## 2022-01-26 MED ORDER — TRIAMCINOLONE ACETONIDE 0.1 % EX OINT: 1.0000 "application " | TOPICAL_OINTMENT | Freq: Two times a day (BID) | CUTANEOUS | 0 refills | Status: DC

## 2022-01-26 NOTE — Progress Notes (Signed)
? ?NEW PATIENT ? ?Date of Service/Encounter:  01/26/22 ? ?Consult requested by: Dorothyann Peng, MD ? ? ?Assessment:  ? ?Pruritus - with negative testing to the entire environmental and food panel ? ?Adverse food reaction - with no clinical reactivity and no allergy testing is positive today (previously had IgG testing performed to foods, which does not correlate with any clinical activity at all) ? ?Plan/Recommendations:  ? ?1. Pruritus ?- Testing was negative to the entire panel, both environmental and food. ?- The testing that your PCP did looked for food exposure, not food ALLERGY ?- So I think that you can safely ignore that.  ?- Copy of testing results provided today.  ?- Start EpiCeram twice daily to the affected areas to help with itching. ?- Samples provided. ?- We are also starting triamcinolone ointment to use twice daily as needed (stronger topical steroid compared to hydrocortisone). ?- Sometimes we need to just let these things play out before we know how to handle it.  ? ?2. Return in about 4 weeks (around 02/23/2022).  ? ?This note in its entirety was forwarded to the Provider who requested this consultation. ? ?Subjective:  ? ?Jody [name] is a 51 y.o. female presenting today for evaluation of  ?Chief Complaint  ?Patient presents with  ? Other  ?  Itching in hands and feet over a month now. Cause unknown. Happens at the same time each night. Starts in hands and then goes to her feet.  ? ? ?Jody [name] has a history of the following: ?Patient Active Problem List  ? Diagnosis Date Noted  ? Prediabetes 01/05/2021  ? Essential hypertension, benign 01/05/2021  ? Ectopic pregnancy, tubal 03/10/2013  ? 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 Consultation for sterilization 03/09/2013  ? Adnexal mass 03/09/2013  ? Pelvic pain in female 03/09/2013  ? IUD failure, pregnant 09/13/2012  ? Rectal bleeding 12/28/2011  ? ? ?History obtained from: chart review and  patient. ? ?Jody [name] was referred by Dorothyann Peng, MD.    ? ?Jody [name] is a 52 y.o. female presenting for an evaluation of itching of hands and feet . ? ?She has had one month of itching of her hands and feet. This only starts in the evening around 9 pm or so. She takes Benadryl and this helps with it. She doesn ot get a rash. Thi sis the hands and the feet. She has had no fevers. She denies skin peeling. She has not tried any ointments at all for this. She has tried some OTC hydrocortisone without relief.  ? ?Husband and kid are not affected. She has had no problems with laundry detergents or soaps. Everything is the same. She has not tried any other antihistamine aside from Benadryl. She has never had this previously. ? ?She denies foods allergies or environmental allergies. She doe snot have eczema. She went to see Dr. Allyne Gee who felt that this is food allergies. She was told to avoid oats as well as grains. She has avoided these things but it did not help at all. She has been avoiding these foods since she had the testing done.  Review of her labs showed that she had an IgG food panel sent which was positive unsurprisingly to nearly everything. ? ?Of note around the same time that this started, she started having dizziness. She thought that the dizziness was related to the eye glasses, but optometry visit was normal. Dr. Allyne Gee thought that she was  dehydrated. She has been drinking more eater which helps prevent the dizziness.   ? ?Otherwise, there is no history of other atopic diseases, including asthma, food allergies, drug allergies, environmental allergies, stinging insect allergies, or contact dermatitis. There is no significant infectious history. Vaccinations are up to date.  ? ? ?Past Medical History: ?Patient Active Problem List  ? Diagnosis Date Noted  ? Prediabetes 01/05/2021  ? Essential hypertension, benign 01/05/2021  ? Ectopic pregnancy, tubal 03/10/2013  ? Hemoperitoneum due to  rupture of left tubal ectopic pregnancy 03/10/2013  ? Consultation for sterilization 03/09/2013  ? Adnexal mass 03/09/2013  ? Pelvic pain in female 03/09/2013  ? IUD failure, pregnant 09/13/2012  ? Rectal bleeding 12/28/2011  ? ? ?Medication List:  ?Allergies as of 01/26/2022   ?No Known Allergies ?  ? ?  ?Medication List  ?  ? ?  ? Accurate as of Jan 26, 2022  6:45 PM. If you have any questions, ask your nurse or doctor.  ?  ?  ? ?  ? ?EpiCeram lotion ?Apply 1 application. topically 2 (two) times daily. ?Started by: Alfonse Spruce, MD ?  ?IRON PO ?Take by mouth daily. ?  ?lisinopril-hydrochlorothiazide 20-12.5 MG tablet ?Commonly known as: ZESTORETIC ?TAKE 1 TABLET BY MOUTH EVERY DAY ?  ?Ozempic (1 MG/DOSE) 4 MG/3ML Sopn ?Generic drug: Semaglutide (1 MG/DOSE) ?Inject 1 mg into the skin once a week. ?  ?triamcinolone ointment 0.1 % ?Commonly known as: KENALOG ?Apply 1 application. topically 2 (two) times daily. ?Started by: Alfonse Spruce, MD ?  ?Vitamin D (Ergocalciferol) 1.25 MG (50000 UNIT) Caps capsule ?Commonly known as: DRISDOL ?Take 1 capsule (50,000 Units total) by mouth every 7 (seven) days. ?  ?Vyvanse 40 MG Chew ?Generic drug: Lisdexamfetamine Dimesylate ?Chew 40 mg by mouth daily. ?  ?Vyvanse 50 MG Chew ?Generic drug: Lisdexamfetamine Dimesylate ?Chew 50 mg by mouth every morning. ?  ?Vyvanse 60 MG Chew ?Generic drug: Lisdexamfetamine Dimesylate ?Chew 60 mg by mouth every morning. ?  ? ?  ? ? ?Birth History: non-contributory ? ?Developmental History: non-contributory ? ?Past Surgical History: ?Past Surgical History:  ?Procedure Laterality Date  ? BREAST REDUCTION SURGERY  09/14/2007  ? bilateral  ? CESAREAN SECTION    ? LAPAROSCOPY Left 03/09/2013  ? Procedure: LAPAROSCOPY OPERATIVE, Bilateral Salpingectomy with Ectopic Pregnancy Left;  Surgeon: Hal Morales, MD;  Location: [facility];  Service: Gynecology;  Laterality: Left;  ? SALPINGECTOMY    ? ? ? ?Family History: ?Family History   ?Problem Relation Age of Onset  ? Hypertension Mother   ? Diabetes Mother   ? Hypertension Maternal Grandmother   ? Eczema Daughter   ? ? ? ?Social History: Jody [name] lives at home with her family.  The remaining house that is 50 years old.  There is hardwood in the main living areas and carpeting in the bedroom.  They have electric heating and central cooling.  There are no animals inside or outside of the home.  There are no dust mite covers on the bedding.  There is no tobacco exposure.  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  There is no HEPA filter in the home.  She does not live near an interstate or industrial area. ? ? ?Review of Systems  ?Constitutional: Negative.  Negative for fever, malaise/fatigue and weight loss.  ?HENT: Negative.  Negative for congestion, ear discharge and ear pain.   ?Eyes:  Negative for  pain, discharge and redness.  ?Respiratory:  Negative for cough, sputum production, shortness of breath and wheezing.   ?Cardiovascular: Negative.  Negative for chest pain and palpitations.  ?Gastrointestinal:  Negative for abdominal pain, heartburn, nausea and vomiting.  ?Skin:  Positive for itching. Negative for rash.  ?Neurological:  Negative for dizziness and headaches.  ?Endo/Heme/Allergies:  Negative for environmental allergies. Does not bruise/bleed easily.   ? ? ? ?Objective:  ? ?Blood pressure 124/74, pulse 75, temperature 98.1 ?F (36.7 ?C), temperature source Temporal, resp. rate 16, height 5' 8.56" (1.741 m), weight 209 lb 9.6 oz (95.1 kg), SpO2 100 %. ?Body mass index is 31.35 kg/m?. ? ? ? ? ?Physical Exam ?Vitals reviewed.  ?Constitutional:   ?   Appearance: She is well-developed.  ?HENT:  ?   Head: Normocephalic and atraumatic.  ?   Right Ear: Tympanic membrane, ear canal and external ear normal. No drainage, swelling or tenderness. Tympanic membrane is not injected, scarred, erythematous, retracted or bulging.  ?   Left Ear: Tympanic  membrane, ear canal and external ear normal. No drainage, swelling or tenderness. Tympanic membrane is not injected, scarred, erythematous, retracted or bulging.  ?   Nose: No nasal deformity, septal deviation, mucos

## 2022-01-26 NOTE — Patient Instructions (Addendum)
1. Pruritus ?- Testing was negative to the entire panel, both environmental and food. ?- The testing that your PCP did looked for food exposure, not food ALLERGY ?- So I think that you can safely ignore that.  ?- Copy of testing results provided today.  ?- Start EpiCeram twice daily to the affected areas to help with itching. ?- Samples provided. ?- We are also starting triamcinolone ointment to use twice daily as needed (stronger topical steroid compared to hydrocortisone). ?- Sometimes we need to just let these things play out before we know how to handle it.  ? ?2. Return in about 4 weeks (around 02/23/2022).  ? ? ?Please inform us of any Emergency Department visits, hospitalizations, or changes in symptoms. Call us before going to the ED for breathing or allergy symptoms since we might be able to fit you in for a sick visit. Feel free to contact us anytime with any questions, problems, or concerns. ? ?It was a pleasure to meet you today! ? ?Websites that have reliable patient information: ?1. American Academy of Asthma, Allergy, and Immunology: www.aaaai.org ?2. Food Allergy Research and Education (FARE): foodallergy.org ?3. Mothers of Asthmatics: http://www.asthmacommunitynetwork.org ?4. [employer] of Allergy, Asthma, and Immunology: MissingWeapons.ca ? ? ?COVID-19 Vaccine Information can be found at: PodExchange.nl For questions related to vaccine distribution or appointments, please email [email or web address] or call [phone].  ? ?We realize that you might be concerned about having an allergic reaction to the COVID19 vaccines. To help with that concern, WE ARE OFFERING THE COVID19 VACCINES IN OUR OFFICE! Ask the front desk for dates!  ? ? ? ??Like? [place] on Facebook and Instagram for our latest updates!  ?  ? ? ?A healthy democracy works best when [employer] participate! Make sure you are registered to vote! If you have moved or changed any of  your contact information, you will need to get this updated before voting! ? ?In some cases, you MAY be able to register to vote online: [email or web address] ? ? ? ? ? ? Airborne Adult Perc - 01/26/22 0934   ? ? Time Antigen Placed 0940   ? Allergen Manufacturer Waynette Buttery   ? Location Back   ? Number of Test 59   ? Panel 1 Select   ? 1. Control-Buffer 50% Glycerol Negative   ? 2. Control-Histamine 1 mg/ml 2+   ? 3. Albumin saline Negative   ? 4. Bahia Negative   ? 5. [place] Negative   ? 6. Johnson Negative   ? 7. Kentucky Blue Negative   ? 8. Meadow Fescue Negative   ? 9. Perennial Rye Negative   ? 10. Sweet Vernal Negative   ? 11. Timothy Negative   ? 12. Cocklebur Negative   ? 13. Burweed Marshelder Negative   ? 14. Ragweed, short Negative   ? 15. Ragweed, Giant Negative   ? 16. Plantain,  English Negative   ? 17. Lamb's Quarters Negative   ? 18. Sheep Sorrell Negative   ? 19. Rough Pigweed Negative   ? 20. Marsh Elder, Rough Negative   ? 21. Mugwort, Common Negative   ? 22. Ash mix Negative   ? 23. Charletta Cousin mix Negative   ? 24. Beech American Negative   ? 25. Box, Elder Negative   ? 26. Cedar, red Negative   ? 27. Cottonwood, [place] Negative   ? 28. Elm mix Negative   ? 29. Hickory Negative   ? 30. Maple mix Negative   ? 31. [place], [place]  mix Negative   ? 32. Pecan Pollen Negative   ? 33. Pine mix Negative   ? 34. Sycamore Eastern Negative   ? 35. Walnut, Black Pollen Negative   ? 36. Alternaria alternata Negative   ? 37. Cladosporium Herbarum Negative   ? 38. Aspergillus mix Negative   ? 39. Penicillium mix Negative   ? 40. Bipolaris sorokiniana (Helminthosporium) Negative   ? 41. Drechslera spicifera (Curvularia) Negative   ? 42. Mucor plumbeus Negative   ? 43. Fusarium moniliforme Negative   ? 44. Aureobasidium pullulans (pullulara) Negative   ? 45. Rhizopus oryzae Negative   ? 46. Botrytis cinera Negative   ? 47. Epicoccum nigrum Negative   ? 48. Phoma betae Negative   ? 49. Candida  Albicans Negative   ? 50. Trichophyton mentagrophytes Negative   ? 51. Mite, D Farinae  5,000 AU/ml Negative   ? 52. Mite, D Pteronyssinus  5,000 AU/ml Negative   ? 53. Cat Hair 10,000 BAU/ml Negative   ? 54.  Dog Epithelia Negative   ? 55. Mixed Feathers Negative   ? 56. Horse Epithelia Negative   ? 57. Cockroach, [place] Negative   ? 58. Mouse Negative   ? 59. Tobacco Leaf Negative   ? ?  ?  ? ?  ? ? Food Adult Perc - 01/26/22 0900   ? ? Time Antigen Placed 0940   ? Allergen Manufacturer Waynette Buttery   ? Location Back   ? Number of allergen test 28   ? 1. Peanut Negative   ? 2. Soybean Negative   ? 3. Wheat Negative   ? 4. Sesame Negative   ? 5. Milk, cow Negative   ? 6. Egg White, Chicken Negative   ? 7. Casein Negative   ? 8. Shellfish Mix Negative   ? 9. Fish Mix Negative   ? 10. Cashew Negative   ? 11. Pecan Food Negative   ? 12. Walnut Food Negative   ? 13. Almond Negative   ? 14. Hazelnut Negative   ? 15. [place] nut Negative   ? 16. Coconut Negative   ? 17. Pistachio Negative   ? 31. Oat  Negative   ? 32. Rye  Negative   ? 36. Saccharomyces Cerevisiae  Negative   ? 37. Pork Negative   ? 41. Lamb Negative   ? 42. Tomato Negative   ? 43. White Potato Negative   ? 49. Onion Negative   ? 53. Corn Negative   ? 64. Chocolate/Cacao bean Negative   ? ?  ?  ? ?  ? ? ? ? ? ? ?

## 2022-02-17 ENCOUNTER — Ambulatory Visit: Payer: BC Managed Care – PPO | Admitting: Internal Medicine

## 2022-02-17 ENCOUNTER — Encounter: Payer: Self-pay | Admitting: Internal Medicine

## 2022-02-17 VITALS — BP 124/80 | HR 94 | Temp 98.5°F | Wt 210.2 lb

## 2022-02-17 DIAGNOSIS — Z6831 Body mass index (BMI) 31.0-31.9, adult: Secondary | ICD-10-CM

## 2022-02-17 DIAGNOSIS — L299 Pruritus, unspecified: Secondary | ICD-10-CM

## 2022-02-17 DIAGNOSIS — I10 Essential (primary) hypertension: Secondary | ICD-10-CM | POA: Diagnosis not present

## 2022-02-17 DIAGNOSIS — F5081 Binge eating disorder: Secondary | ICD-10-CM | POA: Diagnosis not present

## 2022-02-17 DIAGNOSIS — E6609 Other obesity due to excess calories: Secondary | ICD-10-CM

## 2022-02-17 MED ORDER — LISDEXAMFETAMINE DIMESYLATE 30 MG PO CAPS: 30.0000 mg | ORAL_CAPSULE | Freq: Every day | ORAL | 0 refills | Status: DC

## 2022-02-17 NOTE — Patient Instructions (Addendum)
Dandelion root tea Ginger/turmeric tea  Hypertension, Adult Hypertension is another name for high blood pressure. High blood pressure forces your heart to work harder to pump blood. This can cause problems over time. There are two numbers in a blood pressure reading. There is a top number (systolic) over a bottom number (diastolic). It is best to have a blood pressure that is below 120/80. What are the causes? The cause of this condition is not known. Some other conditions can lead to high blood pressure. What increases the risk? Some lifestyle factors can make you more likely to develop high blood pressure: Smoking. Not getting enough exercise or physical activity. Being overweight. Having too much fat, sugar, calories, or salt (sodium) in your diet. Drinking too much alcohol. Other risk factors include: Having any of these conditions: Heart disease. Diabetes. High cholesterol. Kidney disease. Obstructive sleep apnea. Having a family history of high blood pressure and high cholesterol. Age. The risk increases with age. Stress. What are the signs or symptoms? High blood pressure may not cause symptoms. Very high blood pressure (hypertensive crisis) may cause: Headache. Fast or uneven heartbeats (palpitations). Shortness of breath. Nosebleed. Vomiting or feeling like you may vomit (nauseous). Changes in how you see. Very bad chest pain. Feeling dizzy. Seizures. How is this treated? This condition is treated by making healthy lifestyle changes, such as: Eating healthy foods. Exercising more. Drinking less alcohol. Your doctor may prescribe medicine if lifestyle changes do not help enough and if: Your top number is above 130. Your bottom number is above 80. Your personal target blood pressure may vary. Follow these instructions at home: Eating and drinking  If told, follow the DASH eating plan. To follow this plan: Fill one half of your plate at each meal with fruits and  vegetables. Fill one fourth of your plate at each meal with whole grains. Whole grains include whole-wheat pasta, brown rice, and whole-grain bread. Eat or drink low-fat dairy products, such as skim milk or low-fat yogurt. Fill one fourth of your plate at each meal with low-fat (lean) proteins. Low-fat proteins include fish, chicken without skin, eggs, beans, and tofu. Avoid fatty meat, cured and processed meat, or chicken with skin. Avoid pre-made or processed food. Limit the amount of salt in your diet to less than 1,500 mg each day. Do not drink alcohol if: Your doctor tells you not to drink. You are pregnant, may be pregnant, or are planning to become pregnant. If you drink alcohol: Limit how much you have to: 0-1 drink a day for women. 0-2 drinks a day for men. Know how much alcohol is in your drink. In the [place] one drink equals one 12 oz bottle of beer (355 mL), one 5 oz glass of wine (148 mL), or one 1 oz glass of hard liquor (44 mL). Lifestyle  Work with your doctor to stay at a healthy weight or to lose weight. Ask your doctor what the best weight is for you. Get at least 30 minutes of exercise that causes your heart to beat faster (aerobic exercise) most days of the week. This may include walking, swimming, or biking. Get at least 30 minutes of exercise that strengthens your muscles (resistance exercise) at least 3 days a week. This may include lifting weights or doing Pilates. Do not smoke or use any products that contain nicotine or tobacco. If you need help quitting, ask your doctor. Check your blood pressure at home as told by your doctor. Keep all follow-up visits.  Medicines Take over-the-counter and prescription medicines only as told by your doctor. Follow directions carefully. Do not skip doses of blood pressure medicine. The medicine does not work as well if you skip doses. Skipping doses also puts you at risk for problems. Ask your doctor about side effects or  reactions to medicines that you should watch for. Contact a doctor if: You think you are having a reaction to the medicine you are taking. You have headaches that keep coming back. You feel dizzy. You have swelling in your ankles. You have trouble with your vision. Get help right away if: You get a very bad headache. You start to feel mixed up (confused). You feel weak or numb. You feel faint. You have very bad pain in your: Chest. Belly (abdomen). You vomit more than once. You have trouble breathing. These symptoms may be an emergency. Get help right away. Call 911. Do not wait to see if the symptoms will go away. Do not drive yourself to the hospital. Summary Hypertension is another name for high blood pressure. High blood pressure forces your heart to work harder to pump blood. For most people, a normal blood pressure is less than 120/80. Making healthy choices can help lower blood pressure. If your blood pressure does not get lower with healthy choices, you may need to take medicine. This information is not intended to replace advice given to you by your health care provider. Make sure you discuss any questions you have with your health care provider. Document Revised: 06/18/2021 Document Reviewed: 06/18/2021 Elsevier Patient Education  Jody [name].

## 2022-02-17 NOTE — Progress Notes (Incomplete)
[name],acting as a Neurosurgeon for Gwynneth Aliment, MD.,have documented all relevant documentation on the behalf of Gwynneth Aliment, MD,as directed by  Gwynneth Aliment, MD while in the presence of Gwynneth Aliment, MD.  This visit occurred during the SARS-CoV-2 public health emergency.  Safety protocols were in place, including screening questions prior to the visit, additional usage of staff PPE, and extensive cleaning of exam room while observing appropriate contact time as indicated for disinfecting solutions.  Subjective:     Patient ID: Jody [name] , female    DOB: July 28, 1971 , 51 y.o.   MRN: [id number]   Chief Complaint  Patient presents with  . Hypertension    HPI  Pt is here today for HTN follow up. She reports compliance with meds. Patient stated she was Dr.Wallace for her vyvanse but she left her old practice and is now at a new one and she doesn't accept patient's ins. She would like to know what she needs to do.     Hypertension This is a chronic problem. The current episode started more than 1 year ago. The problem is unchanged. The problem is controlled. Pertinent negatives include no chest pain, headaches, orthopnea, palpitations or shortness of breath. There are no associated agents to hypertension. Risk factors for coronary artery disease include obesity and sedentary lifestyle. The current treatment provides moderate improvement. Compliance problems include exercise.     Past Medical History:  Diagnosis Date  . H/O amenorrhea   . History of chicken pox   . Hx of mumps   . Hyperlipidemia   . Hypertension   . Low iron   . Prediabetes   . Yeast infection      Family History  Problem Relation Age of Onset  . Hypertension Mother   . Diabetes Mother   . Hypertension Maternal Grandmother   . Eczema Daughter      Current Outpatient Medications:  Marland Kitchen  Dermatological Products, Misc. [facility]) lotion, Apply 1 application. topically 2 (two) times  daily., Disp: 90 g, Rfl: 5 .  Ferrous Sulfate (IRON PO), Take by mouth daily., Disp: , Rfl:  .  lisinopril-hydrochlorothiazide (ZESTORETIC) 20-12.5 MG tablet, TAKE 1 TABLET BY MOUTH EVERY DAY, Disp: 90 tablet, Rfl: 2 .  Semaglutide, 1 MG/DOSE, (OZEMPIC, 1 MG/DOSE,) 4 MG/3ML SOPN, Inject 1 mg into the skin once a week., Disp: 9 mL, Rfl: 0 .  triamcinolone ointment (KENALOG) 0.1 %, Apply 1 application. topically 2 (two) times daily., Disp: 454 g, Rfl: 0 .  Vitamin D, Ergocalciferol, (DRISDOL) 1.25 MG (50000 UNIT) CAPS capsule, Take 1 capsule (50,000 Units total) by mouth every 7 (seven) days., Disp: 12 capsule, Rfl: 0 .  Lisdexamfetamine Dimesylate (VYVANSE) 40 MG CHEW, Chew 40 mg by mouth daily. (Patient not taking: Reported on 01/26/2022), Disp: 30 tablet, Rfl: 0 .  Lisdexamfetamine Dimesylate (VYVANSE) 50 MG CHEW, Chew 50 mg by mouth every morning. (Patient not taking: Reported on 01/26/2022), Disp: 30 tablet, Rfl: 0 .  Lisdexamfetamine Dimesylate (VYVANSE) 60 MG CHEW, Chew 60 mg by mouth every morning. (Patient not taking: Reported on 01/26/2022), Disp: 30 tablet, Rfl: 0   No Known Allergies   Review of Systems  Constitutional: Negative.   Respiratory: Negative.  Negative for shortness of breath.   Cardiovascular: Negative.  Negative for chest pain, palpitations and orthopnea.  Gastrointestinal: Negative.   Neurological: Negative.  Negative for headaches.  Psychiatric/Behavioral: Negative.      Today's Vitals   02/17/22 0911  BP:  124/80  Pulse: 94  Temp: 98.5 F (36.9 C)  Weight: 210 lb 3.2 oz (95.3 kg)  PainSc: 0-No pain   Body mass index is 31.44 kg/m.  Wt Readings from Last 3 Encounters:  02/17/22 210 lb 3.2 oz (95.3 kg)  01/26/22 209 lb 9.6 oz (95.1 kg)  12/31/21 207 lb 6.4 oz (94.1 kg)    Objective:  Physical Exam Vitals and nursing note reviewed.  Constitutional:      Appearance: Normal appearance.  HENT:     Head: Normocephalic and atraumatic.  Eyes:     Extraocular  Movements: Extraocular movements intact.  Cardiovascular:     Rate and Rhythm: Normal rate and regular rhythm.     Heart sounds: Normal heart sounds.  Pulmonary:     Effort: Pulmonary effort is normal.     Breath sounds: Normal breath sounds.  Musculoskeletal:     Cervical back: Normal range of motion.  Skin:    General: Skin is warm.  Neurological:     General: No focal deficit present.     Mental Status: She is alert.  Psychiatric:        Mood and Affect: Mood normal.        Behavior: Behavior normal.        Assessment And Plan:     1. Essential hypertension, benign  2. Class 1 obesity due to excess calories with body mass index (BMI) of 31.0 to 31.9 in adult, unspecified whether serious comorbidity present     Patient was given opportunity to ask questions. Patient verbalized understanding of the plan and was able to repeat key elements of the plan. All questions were answered to their satisfaction.  Gwynneth Aliment, MD   I, Gwynneth Aliment, MD, have reviewed all documentation for this visit. The documentation on 02/17/22 for the exam, diagnosis, procedures, and orders are all accurate and complete.   IF YOU HAVE BEEN REFERRED TO A SPECIALIST, IT MAY TAKE 1-2 WEEKS TO SCHEDULE/PROCESS THE REFERRAL. IF YOU HAVE NOT HEARD FROM US/SPECIALIST IN TWO WEEKS, PLEASE GIVE [place] A CALL AT [phone].   THE PATIENT IS ENCOURAGED TO PRACTICE SOCIAL DISTANCING DUE TO THE COVID-19 PANDEMIC.

## 2022-03-23 ENCOUNTER — Ambulatory Visit: Payer: BC Managed Care – PPO | Admitting: Allergy & Immunology

## 2022-04-21 ENCOUNTER — Encounter (INDEPENDENT_AMBULATORY_CARE_PROVIDER_SITE_OTHER): Payer: Self-pay

## 2022-05-05 ENCOUNTER — Encounter: Payer: Self-pay | Admitting: Internal Medicine

## 2022-05-05 ENCOUNTER — Ambulatory Visit: Payer: BC Managed Care – PPO | Admitting: Internal Medicine

## 2022-05-05 VITALS — BP 108/80 | HR 87 | Temp 98.1°F | Ht 68.0 in | Wt 211.6 lb

## 2022-05-05 DIAGNOSIS — F50819 Binge eating disorder, unspecified: Secondary | ICD-10-CM | POA: Insufficient documentation

## 2022-05-05 DIAGNOSIS — E6609 Other obesity due to excess calories: Secondary | ICD-10-CM | POA: Insufficient documentation

## 2022-05-05 DIAGNOSIS — E8881 Metabolic syndrome: Secondary | ICD-10-CM | POA: Diagnosis not present

## 2022-05-05 DIAGNOSIS — Z6832 Body mass index (BMI) 32.0-32.9, adult: Secondary | ICD-10-CM

## 2022-05-05 DIAGNOSIS — F5081 Binge eating disorder: Secondary | ICD-10-CM

## 2022-05-05 DIAGNOSIS — I10 Essential (primary) hypertension: Secondary | ICD-10-CM

## 2022-05-05 DIAGNOSIS — Z1211 Encounter for screening for malignant neoplasm of colon: Secondary | ICD-10-CM

## 2022-05-05 LAB — BMP8+EGFR
BUN/Creatinine Ratio: 12 (ref 9–23)
BUN: 10 mg/dL (ref 6–24)
CO2: 25 mmol/L (ref 20–29)
Calcium: 9.3 mg/dL (ref 8.7–10.2)
Chloride: 101 mmol/L (ref 96–106)
Creatinine, Ser: 0.85 mg/dL (ref 0.57–1.00)
Glucose: 86 mg/dL (ref 70–99)
Potassium: 3.7 mmol/L (ref 3.5–5.2)
Sodium: 138 mmol/L (ref 134–144)
eGFR: 84 mL/min/{1.73_m2} (ref >59)

## 2022-05-05 MED ORDER — LISINOPRIL-HYDROCHLOROTHIAZIDE 20-12.5 MG PO TABS: 1.0000 | ORAL_TABLET | Freq: Every day | ORAL | 2 refills | Status: DC

## 2022-05-05 NOTE — Progress Notes (Signed)
I,Victoria T Hamilton,acting as a scribe for Maximino Greenland, MD.,have documented all relevant documentation on the behalf of Maximino Greenland, MD,as directed by  Maximino Greenland, MD while in the presence of Maximino Greenland, MD.    Subjective:     Patient ID: Jody [name] , female    DOB: 07/13/1971 , 51 y.o.   MRN: [id number]   Chief Complaint  Patient presents with   Hypertension    HPI  Pt is here today for HTN follow up. She reports compliance with meds.  She reports compliance with lisinopril/hctz without any issues. She denies headaches, chest pain and shortness of breath.   She has re-established care with Dr. Juleen China at [facility]. She is now on Vyvanse $RemoveBe'40mg'FYDnspeoN$  daily for binge eating d/o and Ozempic weekly for insulin resistance. She admits she is not exercising regularly.     Hypertension This is a chronic problem. The current episode started more than 1 year ago. The problem is unchanged. The problem is controlled. Pertinent negatives include no chest pain, headaches, orthopnea, palpitations or shortness of breath. There are no associated agents to hypertension. Risk factors for coronary artery disease include obesity and sedentary lifestyle. The current treatment provides moderate improvement. Compliance problems include exercise.      Past Medical History:  Diagnosis Date   H/O amenorrhea    History of chicken pox    Hx of mumps    Hyperlipidemia    Hypertension    Low iron    Prediabetes    Yeast infection      Family History  Problem Relation Age of Onset   Hypertension Mother    Diabetes Mother    Hypertension Maternal Grandmother    Eczema Daughter      Current Outpatient Medications:    Ferrous Sulfate (IRON PO), Take by mouth daily., Disp: , Rfl:    Lisdexamfetamine Dimesylate (VYVANSE) 40 MG CHEW, Chew 40 mg by mouth daily., Disp: 30 tablet, Rfl: 0   Semaglutide, 1 MG/DOSE, (OZEMPIC, 1 MG/DOSE,) 4 MG/3ML SOPN, Inject 1 mg into the skin once a  week., Disp: 9 mL, Rfl: 0   triamcinolone ointment (KENALOG) 0.1 %, Apply 1 application. topically 2 (two) times daily., Disp: 454 g, Rfl: 0   Vitamin D, Ergocalciferol, (DRISDOL) 1.25 MG (50000 UNIT) CAPS capsule, Take 1 capsule (50,000 Units total) by mouth every 7 (seven) days., Disp: 12 capsule, Rfl: 0   Carboxymethylcellulose Sod PF (THERATEARS PF) 0.25 % SOLN, , Disp: , Rfl:    Dermatological Products, Misc. (EPICERAM) lotion, Apply 1 application. topically 2 (two) times daily. (Patient not taking: Reported on 05/05/2022), Disp: 90 g, Rfl: 5   lisinopril-hydrochlorothiazide (ZESTORETIC) 20-12.5 MG tablet, Take 1 tablet by mouth daily., Disp: 90 tablet, Rfl: 2   No Known Allergies   Review of Systems  Constitutional: Negative.   Respiratory: Negative.  Negative for shortness of breath.   Cardiovascular: Negative.  Negative for chest pain, palpitations and orthopnea.  Neurological: Negative.  Negative for headaches.  Psychiatric/Behavioral: Negative.       Today's Vitals   05/05/22 0957  BP: 108/80  Pulse: 87  Temp: 98.1 F (36.7 C)  SpO2: 98%  Weight: 211 lb 9.6 oz (96 kg)  Height: $Remove'5\' 8"'PNsnjvl$  (1.727 m)  PainSc: 0-No pain   Body mass index is 32.17 kg/m.  Wt Readings from Last 3 Encounters:  05/05/22 211 lb 9.6 oz (96 kg)  02/17/22 210 lb 3.2 oz (95.3 kg)  01/26/22 209  lb 9.6 oz (95.1 kg)    Objective:  Physical Exam Vitals and nursing note reviewed.  Constitutional:      Appearance: Normal appearance.  HENT:     Head: Normocephalic and atraumatic.  Eyes:     Extraocular Movements: Extraocular movements intact.  Cardiovascular:     Rate and Rhythm: Normal rate and regular rhythm.     Heart sounds: Normal heart sounds.  Pulmonary:     Effort: Pulmonary effort is normal.     Breath sounds: Normal breath sounds.  Musculoskeletal:     Cervical back: Normal range of motion.  Skin:    General: Skin is warm.  Neurological:     General: No focal deficit present.      Mental Status: She is alert.  Psychiatric:        Mood and Affect: Mood normal.        Behavior: Behavior normal.      Assessment And Plan:     1. Essential hypertension, benign Comments: Chronic, well controlled. She will rto in Feb 2023 for her next physical exam. I will check her renal function today.  - BMP8+eGFR  2. Binge eating disorder Comments: Chronic, currently on Vyvanse $RemoveBe'40mg'iukqFjAZk$  daily.   3. Insulin resistance Comments: She is currently on Ozempic as per Dr. Juleen China, [facility].  4. Class 1 obesity due to excess calories without serious comorbidity with body mass index (BMI) of 32.0 to 32.9 in adult Comments: She is encouraged to strive for BMI<30 to decrease cardiac risk, while aiming for at least 150 minutes of exercise per week.   5. Colon cancer screening Comments: She agrees to GI referral for CRC screening. - Ambulatory referral to Gastroenterology  Patient was given opportunity to ask questions. Patient verbalized understanding of the plan and was able to repeat key elements of the plan. All questions were answered to their satisfaction.   I, Maximino Greenland, MD, have reviewed all documentation for this visit. The documentation on 05/05/22 for the exam, diagnosis, procedures, and orders are all accurate and complete.   IF YOU HAVE BEEN REFERRED TO A SPECIALIST, IT MAY TAKE 1-2 WEEKS TO SCHEDULE/PROCESS THE REFERRAL. IF YOU HAVE NOT HEARD FROM US/SPECIALIST IN TWO WEEKS, PLEASE GIVE [place] A CALL AT [phone].   THE PATIENT IS ENCOURAGED TO PRACTICE SOCIAL DISTANCING DUE TO THE COVID-19 PANDEMIC.

## 2022-05-05 NOTE — Patient Instructions (Signed)
Hypertension, Adult ?Hypertension is another name for high blood pressure. High blood pressure forces your heart to work harder to pump blood. This can cause problems over time. ?There are two numbers in a blood pressure reading. There is a top number (systolic) over a bottom number (diastolic). It is best to have a blood pressure that is below 120/80. ?What are the causes? ?The cause of this condition is not known. Some other conditions can lead to high blood pressure. ?What increases the risk? ?Some lifestyle factors can make you more likely to develop high blood pressure: ?Smoking. ?Not getting enough exercise or physical activity. ?Being overweight. ?Having too much fat, sugar, calories, or salt (sodium) in your diet. ?Drinking too much alcohol. ?Other risk factors include: ?Having any of these conditions: ?Heart disease. ?Diabetes. ?High cholesterol. ?Kidney disease. ?Obstructive sleep apnea. ?Having a family history of high blood pressure and high cholesterol. ?Age. The risk increases with age. ?Stress. ?What are the signs or symptoms? ?High blood pressure may not cause symptoms. Very high blood pressure (hypertensive crisis) may cause: ?Headache. ?Fast or uneven heartbeats (palpitations). ?Shortness of breath. ?Nosebleed. ?Vomiting or feeling like you may vomit (nauseous). ?Changes in how you see. ?Very bad chest pain. ?Feeling dizzy. ?Seizures. ?How is this treated? ?This condition is treated by making healthy lifestyle changes, such as: ?Eating healthy foods. ?Exercising more. ?Drinking less alcohol. ?Your doctor may prescribe medicine if lifestyle changes do not help enough and if: ?Your top number is above 130. ?Your bottom number is above 80. ?Your personal target blood pressure may vary. ?Follow these instructions at home: ?Eating and drinking ? ?If told, follow the DASH eating plan. To follow this plan: ?Fill one half of your plate at each meal with fruits and vegetables. ?Fill one fourth of your plate  at each meal with whole grains. Whole grains include whole-wheat pasta, brown rice, and whole-grain bread. ?Eat or drink low-fat dairy products, such as skim milk or low-fat yogurt. ?Fill one fourth of your plate at each meal with low-fat (lean) proteins. Low-fat proteins include fish, chicken without skin, eggs, beans, and tofu. ?Avoid fatty meat, cured and processed meat, or chicken with skin. ?Avoid pre-made or processed food. ?Limit the amount of salt in your diet to less than 1,500 mg each day. ?Do not drink alcohol if: ?Your doctor tells you not to drink. ?You are pregnant, may be pregnant, or are planning to become pregnant. ?If you drink alcohol: ?Limit how much you have to: ?0-1 drink a day for women. ?0-2 drinks a day for men. ?Know how much alcohol is in your drink. In the [place] one drink equals one 12 oz bottle of beer (355 mL), one 5 oz glass of wine (148 mL), or one 1? oz glass of hard liquor (44 mL). ?Lifestyle ? ?Work with your doctor to stay at a healthy weight or to lose weight. Ask your doctor what the best weight is for you. ?Get at least 30 minutes of exercise that causes your heart to beat faster (aerobic exercise) most days of the week. This may include walking, swimming, or biking. ?Get at least 30 minutes of exercise that strengthens your muscles (resistance exercise) at least 3 days a week. This may include lifting weights or doing Pilates. ?Do not smoke or use any products that contain nicotine or tobacco. If you need help quitting, ask your doctor. ?Check your blood pressure at home as told by your doctor. ?Keep all follow-up visits. ?Medicines ?Take over-the-counter and prescription medicines   only as told by your doctor. Follow directions carefully. ?Do not skip doses of blood pressure medicine. The medicine does not work as well if you skip doses. Skipping doses also puts you at risk for problems. ?Ask your doctor about side effects or reactions to medicines that you should watch  for. ?Contact a doctor if: ?You think you are having a reaction to the medicine you are taking. ?You have headaches that keep coming back. ?You feel dizzy. ?You have swelling in your ankles. ?You have trouble with your vision. ?Get help right away if: ?You get a very bad headache. ?You start to feel mixed up (confused). ?You feel weak or numb. ?You feel faint. ?You have very bad pain in your: ?Chest. ?Belly (abdomen). ?You vomit more than once. ?You have trouble breathing. ?These symptoms may be an emergency. Get help right away. Call 911. ?Do not wait to see if the symptoms will go away. ?Do not drive yourself to the hospital. ?Summary ?Hypertension is another name for high blood pressure. ?High blood pressure forces your heart to work harder to pump blood. ?For most people, a normal blood pressure is less than 120/80. ?Making healthy choices can help lower blood pressure. If your blood pressure does not get lower with healthy choices, you may need to take medicine. ?This information is not intended to replace advice given to you by your health care provider. Make sure you discuss any questions you have with your health care provider. ?Document Revised: 06/18/2021 Document Reviewed: 06/18/2021 ?Elsevier Patient Education ? 2023 [employer] ? ?

## 2022-05-14 ENCOUNTER — Other Ambulatory Visit: Payer: Self-pay | Admitting: Obstetrics and Gynecology

## 2022-06-02 ENCOUNTER — Other Ambulatory Visit (HOSPITAL_COMMUNITY): Payer: Self-pay

## 2022-06-03 ENCOUNTER — Other Ambulatory Visit (HOSPITAL_COMMUNITY): Payer: Self-pay

## 2022-06-03 MED ORDER — SEMAGLUTIDE-WEIGHT MANAGEMENT 1 MG/0.5ML ~~LOC~~ SOAJ
1.0000 mg | SUBCUTANEOUS | 0 refills | Status: DC
  Filled 2022-06-03: qty 2, 28d supply, fill #0

## 2022-06-21 ENCOUNTER — Ambulatory Visit: Payer: BC Managed Care – PPO | Admitting: Internal Medicine

## 2022-07-01 ENCOUNTER — Encounter: Payer: Self-pay | Admitting: Internal Medicine

## 2022-07-06 ENCOUNTER — Ambulatory Visit: Payer: BC Managed Care – PPO

## 2022-10-15 LAB — HM COLONOSCOPY

## 2022-10-29 NOTE — Patient Instructions (Signed)

## 2022-10-29 NOTE — Progress Notes (Unsigned)
I,Victoria T Hamilton,acting as a scribe for Maximino Greenland, MD.,have documented all relevant documentation on the behalf of Maximino Greenland, MD,as directed by  Maximino Greenland, MD while in the presence of Maximino Greenland, MD.   Subjective:     Patient ID: Jody [name] , female    DOB: [birth year] , 52 y.o.   MRN: [id number]   Chief Complaint  Patient presents with   Annual Exam   Hypertension    HPI  Pt is here today for physical exam. She is followed by Dr. Charlesetta Garibaldi for her GYN exams.  She reports compliance with lisinopril/hctz without any issues. She denies headaches, chest pain and shortness of breath.    She adds that she had colonoscopy with Dr. Michail Sermon in Feb 2024.   Hypertension This is a chronic problem. The current episode started more than 1 year ago. The problem is unchanged. The problem is controlled. Pertinent negatives include no chest pain, headaches, orthopnea, palpitations or shortness of breath. There are no associated agents to hypertension. Risk factors for coronary artery disease include obesity and sedentary lifestyle. The current treatment provides moderate improvement. Compliance problems include exercise.      Past Medical History:  Diagnosis Date   H/O amenorrhea    History of chicken pox    Hx of mumps    Hyperlipidemia    Hypertension    Low iron    Prediabetes    Yeast infection      Family History  Problem Relation Age of Onset   Hypertension Mother    Diabetes Mother    Hypertension Maternal Grandmother    Eczema Daughter      Current Outpatient Medications:    Carboxymethylcellulose Sod PF (THERATEARS PF) 0.25 % SOLN, , Disp: , Rfl:    Ferrous Sulfate (IRON PO), Take by mouth daily., Disp: , Rfl:    Lisdexamfetamine Dimesylate (VYVANSE) 40 MG CHEW, Chew 40 mg by mouth daily., Disp: 30 tablet, Rfl: 0   triamcinolone ointment (KENALOG) 0.1 %, Apply 1 application. topically 2 (two) times daily., Disp: 454 g, Rfl: 0   Vitamin D,  Ergocalciferol, (DRISDOL) 1.25 MG (50000 UNIT) CAPS capsule, Take 1 capsule (50,000 Units total) by mouth every 7 (seven) days., Disp: 12 capsule, Rfl: 0   Dermatological Products, Misc. [facility]) lotion, Apply 1 application. topically 2 (two) times daily. (Patient not taking: Reported on 05/05/2022), Disp: 90 g, Rfl: 5   lisinopril-hydrochlorothiazide (ZESTORETIC) 20-12.5 MG tablet, Take 1 tablet by mouth daily., Disp: 90 tablet, Rfl: 2   No Known Allergies    The patient states she uses  b/l salpingectomy  for birth control. Last LMP was Patient's last menstrual period was 10/22/2022 (exact date).. Negative for Dysmenorrhea. Negative for: breast discharge, breast lump(s), breast pain and breast self exam. Associated symptoms include abnormal vaginal bleeding. Pertinent negatives include abnormal bleeding (hematology), anxiety, decreased libido, depression, difficulty falling sleep, dyspareunia, history of infertility, nocturia, sexual dysfunction, sleep disturbances, urinary incontinence, urinary urgency, vaginal discharge and vaginal itching. Diet regular.The patient states her exercise level is  intermittent.  . The patient's tobacco use is:  Social History   Tobacco Use  Smoking Status Never   Passive exposure: Never  Smokeless Tobacco Never  . She has been exposed to passive smoke. The patient's alcohol use is:  Social History   Substance and Sexual Activity  Alcohol Use Yes   Alcohol/week: 1.0 standard drink of alcohol   Types: 1 Standard drinks or equivalent per week  Review of Systems  Constitutional: Negative.   HENT: Negative.    Eyes: Negative.   Respiratory: Negative.  Negative for shortness of breath.   Cardiovascular: Negative.  Negative for chest pain, palpitations and orthopnea.  Gastrointestinal: Negative.   Endocrine: Negative.   Genitourinary: Negative.   Musculoskeletal: Negative.   Skin: Negative.   Allergic/Immunologic: Negative.   Neurological: Negative.   Negative for headaches.  Hematological: Negative.   Psychiatric/Behavioral: Negative.       Today's Vitals   11/01/22 1104  BP: 128/86  Pulse: 66  Temp: 98.1 F (36.7 C)  Weight: 219 lb 6.4 oz (99.5 kg)  Height: 5' 8"$  (1.727 m)   Body mass index is 33.36 kg/m.  Wt Readings from Last 3 Encounters:  11/01/22 219 lb 6.4 oz (99.5 kg)  05/05/22 211 lb 9.6 oz (96 kg)  02/17/22 210 lb 3.2 oz (95.3 kg)    BP Readings from Last 3 Encounters:  11/01/22 128/86  05/05/22 108/80  02/17/22 124/80     Objective:  Physical Exam Vitals and nursing note reviewed.  Constitutional:      Appearance: Normal appearance.  HENT:     Head: Normocephalic and atraumatic.     Right Ear: Tympanic membrane, ear canal and external ear normal.     Left Ear: Tympanic membrane, ear canal and external ear normal.     Nose:     Comments: Masked     Mouth/Throat:     Comments: Masked  Eyes:     Extraocular Movements: Extraocular movements intact.     Conjunctiva/sclera: Conjunctivae normal.     Pupils: Pupils are equal, round, and reactive to light.  Cardiovascular:     Rate and Rhythm: Normal rate and regular rhythm.     Pulses: Normal pulses.     Heart sounds: Normal heart sounds.  Pulmonary:     Effort: Pulmonary effort is normal.     Breath sounds: Normal breath sounds.  Chest:  Breasts:    Tanner Score is 5.     Right: Normal.     Left: Normal.     Comments: Healed surgical scars breasts Abdominal:     General: Abdomen is flat. Bowel sounds are normal.     Palpations: Abdomen is soft.  Genitourinary:    Comments: deferred Musculoskeletal:        General: Normal range of motion.     Cervical back: Normal range of motion and neck supple.  Skin:    General: Skin is warm and dry.  Neurological:     General: No focal deficit present.     Mental Status: She is alert and oriented to person, place, and time.  Psychiatric:        Mood and Affect: Mood normal.        Behavior: Behavior  normal.      Assessment And Plan:     1. Encounter for general adult medical examination w/o abnormal findings Comments: A full exam was performed. Importance of monthly self breast exams was discussed with the patient.  PATIENT IS ADVISED TO GET 30-45 MINUTES REGULAR EXERCISE NO LESS THAN FOUR TO FIVE DAYS PER WEEK - BOTH WEIGHTBEARING EXERCISES AND AEROBIC ARE RECOMMENDED.  PATIENT IS ADVISED TO FOLLOW A HEALTHY DIET WITH AT LEAST SIX FRUITS/VEGGIES PER DAY, DECREASE INTAKE OF RED MEAT, AND TO INCREASE FISH INTAKE TO TWO DAYS PER WEEK.  MEATS/FISH SHOULD NOT BE FRIED, BAKED OR BROILED IS PREFERABLE.  IT IS ALSO IMPORTANT TO CUT BACK  ON YOUR SUGAR INTAKE. PLEASE AVOID ANYTHING WITH ADDED SUGAR, CORN SYRUP OR OTHER SWEETENERS. IF YOU MUST USE A SWEETENER, YOU CAN TRY STEVIA. IT IS ALSO IMPORTANT TO AVOID ARTIFICIALLY SWEETENERS AND DIET BEVERAGES. LASTLY, I SUGGEST WEARING SPF 50 SUNSCREEN ON EXPOSED PARTS AND ESPECIALLY WHEN IN THE DIRECT SUNLIGHT FOR AN EXTENDED PERIOD OF TIME.  PLEASE AVOID FAST FOOD RESTAURANTS AND INCREASE YOUR WATER INTAKE. - CMP14+EGFR - CBC - Hemoglobin A1c - Lipid panel  2. Essential hypertension, benign Comments: Chronic, fair control. Goal BP <120/80. EKG performed, NSR w/ RAE. She is encouraged to follow low sodium diet. She will c/w lisinopril/hctz 20/12.38m po qd.  She will f/u in six months for re-evaluation.  - POCT Urinalysis Dipstick (81002) - Microalbumin / creatinine urine ratio - EKG 12-Lead - lisinopril-hydrochlorothiazide (ZESTORETIC) 20-12.5 MG tablet; Take 1 tablet by mouth daily.  Dispense: 90 tablet; Refill: 2  3. Insulin resistance Comments: Chronic, she is encouraged to limit her intake of sugary beverages, foods and processed meats including bacon, sausages and deli meats.  4. Class 1 obesity due to excess calories with serious comorbidity and body mass index (BMI) of 33.0 to 33.9 in adult Comments: She is currently on Wegovy as per [employer] She is encouraged to incorporate more exercise into her daily routine.  Patient was given opportunity to ask questions. Patient verbalized understanding of the plan and was able to repeat key elements of the plan. All questions were answered to their satisfaction.   I, RMaximino Greenland MD, have reviewed all documentation for this visit. The documentation on 11/01/22 for the exam, diagnosis, procedures, and orders are all accurate and complete.   THE PATIENT IS ENCOURAGED TO PRACTICE SOCIAL DISTANCING DUE TO THE COVID-19 PANDEMIC.

## 2022-11-01 ENCOUNTER — Ambulatory Visit (INDEPENDENT_AMBULATORY_CARE_PROVIDER_SITE_OTHER): Payer: BC Managed Care – PPO | Admitting: Internal Medicine

## 2022-11-01 ENCOUNTER — Encounter: Payer: Self-pay | Admitting: Internal Medicine

## 2022-11-01 VITALS — BP 128/86 | HR 66 | Temp 98.1°F | Ht 68.0 in | Wt 219.4 lb

## 2022-11-01 DIAGNOSIS — Z0000 Encounter for general adult medical examination without abnormal findings: Secondary | ICD-10-CM | POA: Diagnosis not present

## 2022-11-01 DIAGNOSIS — L299 Pruritus, unspecified: Secondary | ICD-10-CM

## 2022-11-01 DIAGNOSIS — I10 Essential (primary) hypertension: Secondary | ICD-10-CM

## 2022-11-01 DIAGNOSIS — E6609 Other obesity due to excess calories: Secondary | ICD-10-CM

## 2022-11-01 DIAGNOSIS — D500 Iron deficiency anemia secondary to blood loss (chronic): Secondary | ICD-10-CM

## 2022-11-01 DIAGNOSIS — E88819 Insulin resistance, unspecified: Secondary | ICD-10-CM

## 2022-11-01 DIAGNOSIS — Z6833 Body mass index (BMI) 33.0-33.9, adult: Secondary | ICD-10-CM

## 2022-11-01 MED ORDER — LISINOPRIL-HYDROCHLOROTHIAZIDE 20-12.5 MG PO TABS: 1.0000 | ORAL_TABLET | Freq: Every day | ORAL | 2 refills | Status: DC

## 2022-11-02 ENCOUNTER — Other Ambulatory Visit: Payer: BC Managed Care – PPO

## 2022-11-02 LAB — POCT URINALYSIS DIPSTICK
Bilirubin, UA: NEGATIVE
Blood, UA: NEGATIVE
Glucose, UA: NEGATIVE
Ketones, UA: NEGATIVE
Nitrite, UA: NEGATIVE
Protein, UA: NEGATIVE
Spec Grav, UA: 1.025 (ref 1.010–1.025)
Urobilinogen, UA: 0.2 E.U./dL
pH, UA: 6.5 (ref 5.0–8.0)

## 2022-11-03 ENCOUNTER — Other Ambulatory Visit (HOSPITAL_COMMUNITY): Payer: Self-pay

## 2022-11-03 LAB — CBC
Hematocrit: 34.1 % (ref 34.0–46.6)
Hemoglobin: 10.6 g/dL — ABNORMAL LOW (ref 11.1–15.9)
MCH: 25.2 pg — ABNORMAL LOW (ref 26.6–33.0)
MCHC: 31.1 g/dL — ABNORMAL LOW (ref 31.5–35.7)
MCV: 81 fL (ref 79–97)
Platelets: 387 10*3/uL (ref 150–450)
RBC: 4.2 x10E6/uL (ref 3.77–5.28)
RDW: 13 % (ref 11.7–15.4)
WBC: 6.3 10*3/uL (ref 3.4–10.8)

## 2022-11-03 LAB — CMP14+EGFR
ALT: 12 IU/L (ref 0–32)
AST: 12 IU/L (ref 0–40)
Albumin/Globulin Ratio: 1.4 (ref 1.2–2.2)
Albumin: 4 g/dL (ref 3.9–4.9)
Alkaline Phosphatase: 63 IU/L (ref 44–121)
BUN/Creatinine Ratio: 13 (ref 9–23)
BUN: 9 mg/dL (ref 6–24)
Bilirubin Total: <0.2 mg/dL (ref 0.0–1.2)
CO2: 25 mmol/L (ref 20–29)
Calcium: 9.3 mg/dL (ref 8.7–10.2)
Chloride: 108 mmol/L — ABNORMAL HIGH (ref 96–106)
Creatinine, Ser: 0.67 mg/dL (ref 0.57–1.00)
Globulin, Total: 2.8 g/dL (ref 1.5–4.5)
Glucose: 71 mg/dL (ref 70–99)
Potassium: 4.6 mmol/L (ref 3.5–5.2)
Sodium: 143 mmol/L (ref 134–144)
Total Protein: 6.8 g/dL (ref 6.0–8.5)
eGFR: 106 mL/min/{1.73_m2} (ref >59)

## 2022-11-03 LAB — HEMOGLOBIN A1C
Est. average glucose Bld gHb Est-mCnc: 120 mg/dL
Hgb A1c MFr Bld: 5.8 % — ABNORMAL HIGH (ref 4.8–5.6)

## 2022-11-03 LAB — LIPID PANEL
Chol/HDL Ratio: 2.6 ratio (ref 0.0–4.4)
Cholesterol, Total: 185 mg/dL (ref 100–199)
HDL: 70 mg/dL (ref >39)
LDL Chol Calc (NIH): 96 mg/dL (ref 0–99)
Triglycerides: 105 mg/dL (ref 0–149)
VLDL Cholesterol Cal: 19 mg/dL (ref 5–40)

## 2022-11-03 LAB — MICROALBUMIN / CREATININE URINE RATIO
Creatinine, Urine: 94.7 mg/dL
Microalb/Creat Ratio: 3 mg/g creat (ref 0–29)
Microalbumin, Urine: 3.1 ug/mL

## 2022-11-03 MED ORDER — LISDEXAMFETAMINE DIMESYLATE 50 MG PO CAPS
50.0000 mg | ORAL_CAPSULE | Freq: Every morning | ORAL | 0 refills | Status: DC
  Filled 2022-11-03: qty 30, 30d supply, fill #0

## 2022-11-08 ENCOUNTER — Other Ambulatory Visit (HOSPITAL_COMMUNITY): Payer: Self-pay

## 2022-11-09 ENCOUNTER — Ambulatory Visit (INDEPENDENT_AMBULATORY_CARE_PROVIDER_SITE_OTHER): Payer: BC Managed Care – PPO

## 2022-11-09 ENCOUNTER — Ambulatory Visit: Payer: BC Managed Care – PPO

## 2022-11-09 VITALS — BP 140/68 | HR 77 | Temp 98.5°F | Ht 68.0 in | Wt 219.0 lb

## 2022-11-09 DIAGNOSIS — Z23 Encounter for immunization: Secondary | ICD-10-CM | POA: Diagnosis not present

## 2022-11-09 NOTE — Patient Instructions (Signed)
Recombinant Zoster (Shingles) Vaccine: What You Need to Know 1. Why get vaccinated? Recombinant zoster (shingles) vaccine can prevent shingles. Shingles (also called herpes zoster, or just zoster) is a painful skin rash, usually with blisters. In addition to the rash, shingles can cause fever, headache, chills, or upset stomach. Rarely, shingles can lead to complications such as pneumonia, hearing problems, blindness, brain inflammation (encephalitis), or death. The risk of shingles increases with age. The most common complication of shingles is long-term nerve pain called postherpetic neuralgia (PHN). PHN occurs in the areas where the shingles rash was and can last for months or years after the rash goes away. The pain from PHN can be severe and debilitating. The risk of PHN increases with age. An older adult with shingles is more likely to develop PHN and have longer lasting and more severe pain than a younger person. People with weakened immune systems also have a higher risk of getting shingles and complications from the disease. Shingles is caused by varicella-zoster virus, the same virus that causes chickenpox. After you have chickenpox, the virus stays in your body and can cause shingles later in life. Shingles cannot be passed from one person to another, but the virus that causes shingles can spread and cause chickenpox in someone who has never had chickenpox or has never received chickenpox vaccine. 2. Recombinant shingles vaccine Recombinant shingles vaccine provides strong protection against shingles. By preventing shingles, recombinant shingles vaccine also protects against PHN and other complications. Recombinant shingles vaccine is recommended for: Adults 68 years and older Adults 19 years and older who have a weakened immune system because of disease or treatments Shingles vaccine is given as a two-dose series. For most people, the second dose should be given 2 to 6 months after the first  dose. Some people who have or will have a weakened immune system can get the second dose 1 to 2 months after the first dose. Ask your health care provider for guidance. People who have had shingles in the past and people who have received varicella (chickenpox) vaccine are recommended to get recombinant shingles vaccine. The vaccine is also recommended for people who have already gotten another type of shingles vaccine, the live shingles vaccine. There is no live virus in recombinant shingles vaccine. Shingles vaccine may be given at the same time as other vaccines. 3. Talk with your health care provider Tell your vaccination provider if the person getting the vaccine: Has had an allergic reaction after a previous dose of recombinant shingles vaccine, or has any severe, life-threatening allergies Is currently experiencing an episode of shingles Is pregnant In some cases, your health care provider may decide to postpone shingles vaccination until a future visit. People with minor illnesses, such as a cold, may be vaccinated. People who are moderately or severely ill should usually wait until they recover before getting recombinant shingles vaccine. Your health care provider can give you more information. 4. Risks of a vaccine reaction A sore arm with mild or moderate pain is very common after recombinant shingles vaccine. Redness and swelling can also happen at the site of the injection. Tiredness, muscle pain, headache, shivering, fever, stomach pain, and nausea are common after recombinant shingles vaccine. These side effects may temporarily prevent a vaccinated person from doing regular activities. Symptoms usually go away on their own in 2 to 3 days. You should still get the second dose of recombinant shingles vaccine even if you had one of these reactions after the first dose. Guillain-Barr  syndrome (GBS), a serious nervous system disorder, has been reported very rarely after recombinant zoster  vaccine. People sometimes faint after medical procedures, including vaccination. Tell your provider if you feel dizzy or have vision changes or ringing in the ears. As with any medicine, there is a very remote chance of a vaccine causing a severe allergic reaction, other serious injury, or death. 5. What if there is a serious problem? An allergic reaction could occur after the vaccinated person leaves the clinic. If you see signs of a severe allergic reaction (hives, swelling of the face and throat, difficulty breathing, a fast heartbeat, dizziness, or weakness), call 9-1-1 and get the person to the nearest hospital. For other signs that concern you, call your health care provider. Adverse reactions should be reported to the Vaccine Adverse Event Reporting System (VAERS). Your health care provider will usually file this report, or you can do it yourself. Visit the VAERS website at www.vaers.SamedayNews.es or call 640-507-4678. VAERS is only for reporting reactions, and VAERS staff members do not give medical advice. 6. How can I learn more? Ask your health care provider. Call your local or state health department. Visit the website of the Food and Drug Administration (FDA) for vaccine package inserts and additional information at http://lopez-wang.org/. Contact the Centers for Disease Control and Prevention (CDC): Call 432-603-5164 (1-800-CDC-INFO) or Visit CDC's website at http://hunter.com/. Source: CDC Vaccine Information Statement Recombinant Zoster Vaccine (10/17/2020) This same material is available at http://www.wolf.info/ for no charge. This information is not intended to replace advice given to you by your health care provider. Make sure you discuss any questions you have with your health care provider. Document Revised: 06/09/2022 Document Reviewed: 06/09/2022 Elsevier Patient Education  Jody [name].

## 2022-11-09 NOTE — Progress Notes (Signed)
Patient presents today for a shingrix shot.

## 2022-11-23 ENCOUNTER — Other Ambulatory Visit (HOSPITAL_COMMUNITY): Payer: Self-pay

## 2022-12-01 ENCOUNTER — Ambulatory Visit: Payer: BC Managed Care – PPO | Admitting: Internal Medicine

## 2022-12-01 VITALS — BP 130/76 | HR 98 | Temp 98.0°F | Ht 68.0 in | Wt 208.4 lb

## 2022-12-01 DIAGNOSIS — F5081 Binge eating disorder: Secondary | ICD-10-CM | POA: Diagnosis not present

## 2022-12-01 DIAGNOSIS — F419 Anxiety disorder, unspecified: Secondary | ICD-10-CM | POA: Diagnosis not present

## 2022-12-01 DIAGNOSIS — F1593 Other stimulant use, unspecified with withdrawal: Secondary | ICD-10-CM | POA: Diagnosis not present

## 2022-12-01 NOTE — Patient Instructions (Signed)

## 2022-12-01 NOTE — Progress Notes (Signed)
Hershal CoriaI,Ciera J Martin,acting as a Neurosurgeonscribe for Gwynneth Alimentobyn N Alynn Ellithorpe, MD.,have documented all relevant documentation on the behalf of Gwynneth Alimentobyn N Brenly Trawick, MD,as directed by  Gwynneth Alimentobyn N Jasyah Theurer, MD while in the presence of Gwynneth Alimentobyn N Markala Sitts, MD.    Subjective:     Patient ID: Jody [name] , female    DOB: 25-Aug-1971 , 52 y.o.   MRN: [id number]   Chief Complaint  Patient presents with   Anxiety   Depression    HPI  Patient states she is having some mental issues, patient states she can't explain it. She states yesterday she felt like she was having a panic attack, she states it could be anxiety. She states she feels beyond sad. She states she first felt this way yesterday, and it started in her car on the way to work and lasted about all day. She admits she stopped the Vyvanse abruptly, she thinks her sx started then. She denies having any new stressful situation.   BP Readings from Last 3 Encounters: 12/01/22 : 130/76 11/09/22 : (!) 140/68 11/01/22 : 128/86       Past Medical History:  Diagnosis Date   H/O amenorrhea    History of chicken pox    Hx of mumps    Hyperlipidemia    Hypertension    Low iron    Prediabetes    Yeast infection      Family History  Problem Relation Age of Onset   Hypertension Mother    Diabetes Mother    Hypertension Maternal Grandmother    Eczema Daughter      Current Outpatient Medications:    Ferrous Sulfate (IRON PO), Take by mouth daily., Disp: , Rfl:    lisdexamfetamine (VYVANSE) 50 MG capsule, Take 1 capsule (50 mg total) by mouth every morning., Disp: 30 capsule, Rfl: 0   Lisdexamfetamine Dimesylate (VYVANSE) 40 MG CHEW, Chew 40 mg by mouth daily., Disp: 30 tablet, Rfl: 0   lisinopril-hydrochlorothiazide (ZESTORETIC) 20-12.5 MG tablet, Take 1 tablet by mouth daily., Disp: 90 tablet, Rfl: 2   triamcinolone ointment (KENALOG) 0.1 %, Apply 1 application. topically 2 (two) times daily., Disp: 454 g, Rfl: 0   Vitamin D, Ergocalciferol, (DRISDOL) 1.25  MG (50000 UNIT) CAPS capsule, Take 1 capsule (50,000 Units total) by mouth every 7 (seven) days., Disp: 12 capsule, Rfl: 0   Carboxymethylcellulose Sod PF (THERATEARS PF) 0.25 % SOLN, , Disp: , Rfl:    Dermatological Products, Misc. (EPICERAM) lotion, Apply 1 application. topically 2 (two) times daily. (Patient not taking: Reported on 05/05/2022), Disp: 90 g, Rfl: 5   propranolol (INDERAL) 20 MG tablet, TAKE 1 TABLET BY MOUTH THREE TIMES DAILY AS NEEDED FOR ANXIETY. 30 DAYS for 90, Disp: , Rfl:    No Known Allergies   Review of Systems  Constitutional: Negative.   Respiratory: Negative.    Cardiovascular: Negative.   Neurological: Negative.   Psychiatric/Behavioral: Negative.       Today's Vitals   12/01/22 1002  BP: 130/76  Pulse: 98  Temp: 98 F (36.7 C)  TempSrc: Oral  Weight: 208 lb 6.4 oz (94.5 kg)  Height: 5\' 8"  (1.727 m)  PainSc: 0-No pain   Body mass index is 31.69 kg/m.  Wt Readings from Last 3 Encounters:  12/01/22 208 lb 6.4 oz (94.5 kg)  11/09/22 219 lb (99.3 kg)  11/01/22 219 lb 6.4 oz (99.5 kg)    Objective:  Physical Exam Vitals and nursing note reviewed.  Constitutional:  Appearance: Normal appearance.  HENT:     Head: Normocephalic and atraumatic.     Nose:     Comments: Masked     Mouth/Throat:     Comments: masked Eyes:     Extraocular Movements: Extraocular movements intact.  Cardiovascular:     Rate and Rhythm: Normal rate and regular rhythm.     Heart sounds: Normal heart sounds.  Pulmonary:     Effort: Pulmonary effort is normal.     Breath sounds: Normal breath sounds.  Musculoskeletal:     Cervical back: Normal range of motion.  Skin:    General: Skin is warm.  Neurological:     General: No focal deficit present.     Mental Status: She is alert.  Psychiatric:        Mood and Affect: Mood normal.        Behavior: Behavior normal.         Assessment And Plan:     1. Anxiety Comments: Possibly triggered by abrupt cessation  of Vyvanse. Pt advised ideally, she should be weaned off of the medication. Advised to start Mg nightly.  2. Binge eating disorder Comments: Previously on Vyvanse as per Dr. Earlene PlaterWallace, she has since stopped the medication.  3. Withdrawal from other stimulant drug (HCC) Comments: I think her sx are a result of abrupt cessation of Vyvanse. Advised to f/u w/ me next week to let me know how she is feeling.   Patient was given opportunity to ask questions. Patient verbalized understanding of the plan and was able to repeat key elements of the plan. All questions were answered to their satisfaction.   I, Gwynneth Alimentobyn N Darrien Laakso, MD, have reviewed all documentation for this visit. The documentation on 12/01/22 for the exam, diagnosis, procedures, and orders are all accurate and complete.   IF YOU HAVE BEEN REFERRED TO A SPECIALIST, IT MAY TAKE 1-2 WEEKS TO SCHEDULE/PROCESS THE REFERRAL. IF YOU HAVE NOT HEARD FROM US/SPECIALIST IN TWO WEEKS, PLEASE GIVE US A CALL AT [phone].   THE PATIENT IS ENCOURAGED TO PRACTICE SOCIAL DISTANCING DUE TO THE COVID-19 PANDEMIC.

## 2022-12-02 ENCOUNTER — Encounter: Payer: Self-pay | Admitting: Internal Medicine

## 2022-12-02 ENCOUNTER — Other Ambulatory Visit: Payer: Self-pay | Admitting: Internal Medicine

## 2022-12-02 ENCOUNTER — Other Ambulatory Visit: Payer: BC Managed Care – PPO

## 2022-12-02 DIAGNOSIS — R5383 Other fatigue: Secondary | ICD-10-CM

## 2022-12-03 LAB — CBC
Hematocrit: 37.4 % (ref 34.0–46.6)
Hemoglobin: 11.9 g/dL (ref 11.1–15.9)
MCH: 24.8 pg — ABNORMAL LOW (ref 26.6–33.0)
MCHC: 31.8 g/dL (ref 31.5–35.7)
MCV: 78 fL — ABNORMAL LOW (ref 79–97)
Platelets: 449 10*3/uL (ref 150–450)
RBC: 4.79 x10E6/uL (ref 3.77–5.28)
RDW: 13.2 % (ref 11.7–15.4)
WBC: 7.1 10*3/uL (ref 3.4–10.8)

## 2022-12-03 LAB — CMP14+EGFR
ALT: 9 IU/L (ref 0–32)
AST: 16 IU/L (ref 0–40)
Albumin/Globulin Ratio: 1.4 (ref 1.2–2.2)
Albumin: 4.5 g/dL (ref 3.9–4.9)
Alkaline Phosphatase: 75 IU/L (ref 44–121)
BUN/Creatinine Ratio: 10 (ref 9–23)
BUN: 8 mg/dL (ref 6–24)
Bilirubin Total: <0.2 mg/dL (ref 0.0–1.2)
CO2: 24 mmol/L (ref 20–29)
Calcium: 9.6 mg/dL (ref 8.7–10.2)
Chloride: 99 mmol/L (ref 96–106)
Creatinine, Ser: 0.83 mg/dL (ref 0.57–1.00)
Globulin, Total: 3.3 g/dL (ref 1.5–4.5)
Glucose: 98 mg/dL (ref 70–99)
Potassium: 3.9 mmol/L (ref 3.5–5.2)
Sodium: 137 mmol/L (ref 134–144)
Total Protein: 7.8 g/dL (ref 6.0–8.5)
eGFR: 86 mL/min/{1.73_m2} (ref >59)

## 2022-12-03 LAB — TSH: TSH: 0.333 u[IU]/mL — ABNORMAL LOW (ref 0.450–4.500)

## 2022-12-30 ENCOUNTER — Encounter: Payer: Self-pay | Admitting: Internal Medicine

## 2022-12-30 ENCOUNTER — Telehealth: Payer: BC Managed Care – PPO | Admitting: Internal Medicine

## 2022-12-30 DIAGNOSIS — N951 Menopausal and female climacteric states: Secondary | ICD-10-CM

## 2022-12-30 DIAGNOSIS — F419 Anxiety disorder, unspecified: Secondary | ICD-10-CM | POA: Diagnosis not present

## 2022-12-30 MED ORDER — ESCITALOPRAM OXALATE 10 MG PO TABS: 10.0000 mg | ORAL_TABLET | Freq: Every day | ORAL | 2 refills | Status: DC

## 2022-12-30 NOTE — Progress Notes (Signed)
Virtual Visit via Video    This visit type was conducted due to national recommendations for restrictions regarding the COVID-19 Pandemic (e.g. social distancing) in an effort to limit this patient's exposure and mitigate transmission in our community.  Due to her co-morbid illnesses, this patient is at least at moderate risk for complications without adequate follow up.  This format is felt to be most appropriate for this patient at this time.  All issues noted in this document were discussed and addressed.  A limited physical exam was performed with this format.    This visit type was conducted due to national recommendations for restrictions regarding the COVID-19 Pandemic (e.g. social distancing) in an effort to limit this patient's exposure and mitigate transmission in our community.  Patients identity confirmed using two different identifiers.  This format is felt to be most appropriate for this patient at this time.  All issues noted in this document were discussed and addressed.  No physical exam was performed (except for noted visual exam findings with Video Visits).    Date:  01/06/2023   ID:  Jody [name], DOB 1971/04/19, MRN [id number]  Patient Location:  Home  Provider location:   Office    Chief Complaint:  "I have anxiety."  History of Present Illness:    Jody [name] is a 52 y.o. female who presents via video conferencing for a telehealth visit today.    The patient does not have symptoms concerning for COVID-19 infection (fever, chills, cough, or new shortness of breath).   Patient presents virtually today for anxiety follow up. She prefers this type of visit today.  She no longer takes Vyvanse. She reports she has had more waves of anxiety. She is not sure what is triggering her sx, other than stress. She thinks this could be a symptom of menopause. She does not feel depressed at this time. She does admit to excessive worry.      Anxiety Presents for  follow-up visit. Symptoms include excessive worry and nervous/anxious behavior. Patient reports no compulsions, feeling of choking, nausea or suicidal ideas.       Past Medical History:  Diagnosis Date   H/O amenorrhea    History of chicken pox    Hx of mumps    Hyperlipidemia    Hypertension    Low iron    Prediabetes    Yeast infection    Past Surgical History:  Procedure Laterality Date   BREAST REDUCTION SURGERY  09/14/2007   bilateral   CESAREAN SECTION     LAPAROSCOPY Left 03/09/2013   Procedure: LAPAROSCOPY OPERATIVE, Bilateral Salpingectomy with Ectopic Pregnancy Left;  Surgeon: Hal Morales, MD;  Location: [facility];  Service: Gynecology;  Laterality: Left;   SALPINGECTOMY       Current Meds  Medication Sig   escitalopram (LEXAPRO) 10 MG tablet Take 1 tablet (10 mg total) by mouth daily.   Ferrous Sulfate (IRON PO) Take by mouth daily.   lisinopril-hydrochlorothiazide (ZESTORETIC) 20-12.5 MG tablet Take 1 tablet by mouth daily.     Allergies:   Patient has no known allergies.   Social History   Tobacco Use   Smoking status: Never    Passive exposure: Never   Smokeless tobacco: Never  Vaping Use   Vaping Use: Never used  Substance Use Topics   Alcohol use: Yes    Alcohol/week: 1.0 standard drink of alcohol    Types: 1 Standard drinks or equivalent per week   Drug use:  No     Family Hx: The patient's family history includes Diabetes in her mother; Eczema in her daughter; Hypertension in her maternal grandmother and mother.  ROS:   Please see the history of present illness.    Review of Systems  Constitutional: Negative.   HENT: Negative.    Respiratory: Negative.    Gastrointestinal: Negative.  Negative for nausea.  Genitourinary: Negative.   Neurological: Negative.   Endo/Heme/Allergies: Negative.   Psychiatric/Behavioral:  Negative for suicidal ideas. The patient is nervous/anxious.     All other systems reviewed and are  negative.   Labs/Other Tests and Data Reviewed:    Recent Labs: 12/02/2022: ALT 9; BUN 8; Creatinine, Ser 0.83; Hemoglobin 11.9; Platelets 449; Potassium 3.9; Sodium 137; TSH 0.333   Recent Lipid Panel Lab Results  Component Value Date/Time   CHOL 185 11/02/2022 08:51 AM   TRIG 105 11/02/2022 08:51 AM   HDL 70 11/02/2022 08:51 AM   CHOLHDL 2.6 11/02/2022 08:51 AM   LDLCALC 96 11/02/2022 08:51 AM    Wt Readings from Last 3 Encounters:  12/01/22 208 lb 6.4 oz (94.5 kg)  11/09/22 219 lb (99.3 kg)  11/01/22 219 lb 6.4 oz (99.5 kg)     Exam:    Vital Signs:  LMP  (LMP Unknown)     Physical Exam Vitals and nursing note reviewed.  Constitutional:      Appearance: Normal appearance.  HENT:     Head: Normocephalic and atraumatic.  Eyes:     Extraocular Movements: Extraocular movements intact.  Pulmonary:     Effort: Pulmonary effort is normal.  Musculoskeletal:     Cervical back: Normal range of motion.  Neurological:     Mental Status: She is alert and oriented to person, place, and time.  Psychiatric:        Mood and Affect: Affect normal.     ASSESSMENT & PLAN:     1. Anxiety Comments: I will start escitalopram,  daily. Possible side effects d/w pt. She will c/w w/ her nightly meditation. She will f/u in 4-6 weeks for f/u.  2. Perimenopausal Comments: She is advised that palpitations and anxiety are known sx of menopause. She may benefit from magnesium nightly.    COVID-19 Education: The signs and symptoms of COVID-19 were discussed with the patient and how to seek care for testing (follow up with PCP or arrange E-visit).  The importance of social distancing was discussed today.  Patient Risk:   After full review of this patients clinical status, I feel that they are at least moderate risk at this time.  Time:   Today, I have spent 12 minutes/ seconds with the patient with telehealth technology discussing above diagnoses.     Medication  Adjustments/Labs and Tests Ordered: Current medicines are reviewed at length with the patient today.  Concerns regarding medicines are outlined above.   Tests Ordered: No orders of the defined types were placed in this encounter.   Medication Changes: Meds ordered this encounter  Medications   escitalopram (LEXAPRO) 10 MG tablet    Sig: Take 1 tablet (10 mg total) by mouth daily.    Dispense:  30 tablet    Refill:  2    Disposition:  Follow up in 6 week(s)  Signed, Gwynneth Aliment, MD

## 2022-12-30 NOTE — Patient Instructions (Signed)

## 2023-01-25 ENCOUNTER — Other Ambulatory Visit: Payer: Self-pay | Admitting: Internal Medicine

## 2023-02-14 ENCOUNTER — Telehealth (INDEPENDENT_AMBULATORY_CARE_PROVIDER_SITE_OTHER): Payer: Federal, State, Local not specified - PPO | Admitting: Internal Medicine

## 2023-02-14 ENCOUNTER — Encounter: Payer: Self-pay | Admitting: Internal Medicine

## 2023-02-14 DIAGNOSIS — N951 Menopausal and female climacteric states: Secondary | ICD-10-CM | POA: Diagnosis not present

## 2023-02-14 DIAGNOSIS — R002 Palpitations: Secondary | ICD-10-CM | POA: Diagnosis not present

## 2023-02-14 DIAGNOSIS — F419 Anxiety disorder, unspecified: Secondary | ICD-10-CM

## 2023-02-14 DIAGNOSIS — R7989 Other specified abnormal findings of blood chemistry: Secondary | ICD-10-CM | POA: Diagnosis not present

## 2023-02-14 NOTE — Progress Notes (Signed)
Virtual Visit via Video   This visit type was conducted due to national recommendations for restrictions regarding the COVID-19 Pandemic (e.g. social distancing) in an effort to limit this patient's exposure and mitigate transmission in our community.  Due to her co-morbid illnesses, this patient is at least at moderate risk for complications without adequate follow up.  This format is felt to be most appropriate for this patient at this time.  All issues noted in this document were discussed and addressed.  A limited physical exam was performed with this format.    This visit type was conducted due to national recommendations for restrictions regarding the COVID-19 Pandemic (e.g. social distancing) in an effort to limit this patient's exposure and mitigate transmission in our community.  Patients identity confirmed using two different identifiers.  This format is felt to be most appropriate for this patient at this time.  All issues noted in this document were discussed and addressed.  No physical exam was performed (except for noted visual exam findings with Video Visits).    Date:  02/14/2023   ID:  Jody [name], DOB 03-03-1971, MRN [id number]  Patient Location:  Home  Provider location:   Office    Chief Complaint:  "I have med f/u"  History of Present Illness:    Jody [name] is a 52 y.o. female who presents via video conferencing for a telehealth visit today.    The patient does have symptoms concerning for COVID-19 infection (fever, chills, cough, or new shortness of breath).   Patient presents today for virtual visit. She prefers this method of assessment.  She presents for med check for Lexapro. She reports compliance with medication. She has not had any issues with the medication. She has felt well while on the medication. She is finally "feeling like herself'.  She has no specific questions or concerns.      Past Medical History:  Diagnosis Date   H/O  amenorrhea    History of chicken pox    Hx of mumps    Hyperlipidemia    Hypertension    Low iron    Prediabetes    Yeast infection    Past Surgical History:  Procedure Laterality Date   BREAST REDUCTION SURGERY  09/14/2007   bilateral   CESAREAN SECTION     LAPAROSCOPY Left 03/09/2013   Procedure: LAPAROSCOPY OPERATIVE, Bilateral Salpingectomy with Ectopic Pregnancy Left;  Surgeon: Hal Morales, MD;  Location: [facility];  Service: Gynecology;  Laterality: Left;   SALPINGECTOMY       Current Meds  Medication Sig   escitalopram (LEXAPRO) 10 MG tablet TAKE 1 TABLET BY MOUTH EVERY DAY   Ferrous Sulfate (IRON PO) Take by mouth daily.   lisinopril-hydrochlorothiazide (ZESTORETIC) 20-12.5 MG tablet Take 1 tablet by mouth daily.     Allergies:   Patient has no known allergies.   Social History   Tobacco Use   Smoking status: Never    Passive exposure: Never   Smokeless tobacco: Never  Vaping Use   Vaping Use: Never used  Substance Use Topics   Alcohol use: Yes    Alcohol/week: 1.0 standard drink of alcohol    Types: 1 Standard drinks or equivalent per week   Drug use: No     Family Hx: The patient's family history includes Diabetes in her mother; Eczema in her daughter; Hypertension in her maternal grandmother and mother.  ROS:   Please see the history of present illness.  Review of Systems  Constitutional: Negative.   HENT: Negative.    Respiratory: Negative.    Cardiovascular:  Positive for palpitations.  Gastrointestinal: Negative.   Musculoskeletal: Negative.   Neurological: Negative.   Endo/Heme/Allergies: Negative.   Psychiatric/Behavioral: Negative.      All other systems reviewed and are negative.   Labs/Other Tests and Data Reviewed:    Recent Labs: 12/02/2022: ALT 9; BUN 8; Creatinine, Ser 0.83; Hemoglobin 11.9; Platelets 449; Potassium 3.9; Sodium 137; TSH 0.333   Recent Lipid Panel Lab Results  Component Value Date/Time   CHOL 185  11/02/2022 08:51 AM   TRIG 105 11/02/2022 08:51 AM   HDL 70 11/02/2022 08:51 AM   CHOLHDL 2.6 11/02/2022 08:51 AM   LDLCALC 96 11/02/2022 08:51 AM    Wt Readings from Last 3 Encounters:  12/01/22 208 lb 6.4 oz (94.5 kg)  11/09/22 219 lb (99.3 kg)  11/01/22 219 lb 6.4 oz (99.5 kg)     Exam:    Vital Signs:  There were no vitals taken for this visit.    Physical Exam Vitals and nursing note reviewed.  Constitutional:      Appearance: Normal appearance.  HENT:     Head: Normocephalic and atraumatic.  Eyes:     Extraocular Movements: Extraocular movements intact.  Pulmonary:     Effort: Pulmonary effort is normal.  Musculoskeletal:     Cervical back: Normal range of motion.  Neurological:     Mental Status: She is alert and oriented to person, place, and time.  Psychiatric:        Mood and Affect: Affect normal.     ASSESSMENT & PLAN:    1. Anxiety Comments: Chronic, improved with use of escitalopram. She wishes to continue with 10mg  dosage. She has in-person visit scheduled for Aug 2024. - TSH; Future - T4, Free; Future  2. Palpitations Comments: Intermittent, occurs w/ hot flashes. If persistent, will need further evaluation. She agrees to rto next week for lab work. - TSH; Future - T4, Free; Future - Magnesium; Future - BMP8+eGFR  3. Low TSH level Comments: She agrees to come in next week for a lab visit.  4. Perimenopause Comments: LMP Feb 09, 2023. She has started to have hot flashes. She has upcoming appt with GYN to discuss further. May consider [place] in the future.   COVID-19 Education: The signs and symptoms of COVID-19 were discussed with the patient and how to seek care for testing (follow up with PCP or arrange E-visit).  The importance of social distancing was discussed today.  Patient Risk:   After full review of this patients clinical status, I feel that they are at least moderate risk at this time.  Time:   Today, I have spent 17 minutes/  seconds with the patient with telehealth technology discussing above diagnoses.     Medication Adjustments/Labs and Tests Ordered: Current medicines are reviewed at length with the patient today.  Concerns regarding medicines are outlined above.   Tests Ordered: Orders Placed This Encounter  Procedures   TSH   T4, Free   Magnesium   BMP8+eGFR    Medication Changes: No orders of the defined types were placed in this encounter.   Disposition:  Follow up prn. She agrees to rto in 2 weeks for lab visit to check labs as stated above.   Signed, Gwynneth Aliment, MD

## 2023-02-22 ENCOUNTER — Other Ambulatory Visit: Payer: Federal, State, Local not specified - PPO

## 2023-02-22 DIAGNOSIS — F419 Anxiety disorder, unspecified: Secondary | ICD-10-CM | POA: Diagnosis not present

## 2023-02-22 DIAGNOSIS — R002 Palpitations: Secondary | ICD-10-CM

## 2023-02-23 LAB — TSH: TSH: 1.06 u[IU]/mL (ref 0.450–4.500)

## 2023-02-23 LAB — MAGNESIUM: Magnesium: 1.9 mg/dL (ref 1.6–2.3)

## 2023-02-23 LAB — T4, FREE: Free T4: 1.15 ng/dL (ref 0.82–1.77)

## 2023-03-09 DIAGNOSIS — Z01411 Encounter for gynecological examination (general) (routine) with abnormal findings: Secondary | ICD-10-CM | POA: Diagnosis not present

## 2023-03-09 DIAGNOSIS — Z1231 Encounter for screening mammogram for malignant neoplasm of breast: Secondary | ICD-10-CM | POA: Diagnosis not present

## 2023-03-09 DIAGNOSIS — Z01419 Encounter for gynecological examination (general) (routine) without abnormal findings: Secondary | ICD-10-CM | POA: Diagnosis not present

## 2023-03-09 DIAGNOSIS — Z6834 Body mass index (BMI) 34.0-34.9, adult: Secondary | ICD-10-CM | POA: Diagnosis not present

## 2023-03-09 DIAGNOSIS — R87810 Cervical high risk human papillomavirus (HPV) DNA test positive: Secondary | ICD-10-CM | POA: Diagnosis not present

## 2023-03-10 ENCOUNTER — Encounter: Payer: Self-pay | Admitting: Internal Medicine

## 2023-05-02 ENCOUNTER — Encounter: Payer: Self-pay | Admitting: Internal Medicine

## 2023-05-02 ENCOUNTER — Other Ambulatory Visit: Payer: Self-pay | Admitting: Internal Medicine

## 2023-05-02 ENCOUNTER — Ambulatory Visit: Payer: Federal, State, Local not specified - PPO | Admitting: Internal Medicine

## 2023-05-02 VITALS — BP 110/82 | HR 77 | Temp 98.6°F | Ht 68.0 in | Wt 231.2 lb

## 2023-05-02 DIAGNOSIS — K219 Gastro-esophageal reflux disease without esophagitis: Secondary | ICD-10-CM

## 2023-05-02 DIAGNOSIS — I10 Essential (primary) hypertension: Secondary | ICD-10-CM | POA: Diagnosis not present

## 2023-05-02 DIAGNOSIS — F419 Anxiety disorder, unspecified: Secondary | ICD-10-CM | POA: Diagnosis not present

## 2023-05-02 DIAGNOSIS — Z6835 Body mass index (BMI) 35.0-35.9, adult: Secondary | ICD-10-CM

## 2023-05-02 DIAGNOSIS — N951 Menopausal and female climacteric states: Secondary | ICD-10-CM

## 2023-05-02 DIAGNOSIS — R7309 Other abnormal glucose: Secondary | ICD-10-CM | POA: Insufficient documentation

## 2023-05-02 LAB — CMP14+EGFR
ALT: 12 IU/L (ref 0–32)
AST: 13 IU/L (ref 0–40)
Albumin: 3.9 g/dL (ref 3.9–4.9)
Alkaline Phosphatase: 66 IU/L (ref 44–121)
BUN/Creatinine Ratio: 16 (ref 9–23)
BUN: 13 mg/dL (ref 6–24)
Bilirubin Total: <0.2 mg/dL (ref 0.0–1.2)
CO2: 22 mmol/L (ref 20–29)
Calcium: 9.4 mg/dL (ref 8.7–10.2)
Chloride: 107 mmol/L — ABNORMAL HIGH (ref 96–106)
Creatinine, Ser: 0.81 mg/dL (ref 0.57–1.00)
Globulin, Total: 3 g/dL (ref 1.5–4.5)
Glucose: 94 mg/dL (ref 70–99)
Potassium: 4.6 mmol/L (ref 3.5–5.2)
Sodium: 140 mmol/L (ref 134–144)
Total Protein: 6.9 g/dL (ref 6.0–8.5)
eGFR: 88 mL/min/{1.73_m2} (ref >59)

## 2023-05-02 LAB — HEMOGLOBIN A1C
Est. average glucose Bld gHb Est-mCnc: 131 mg/dL
Hgb A1c MFr Bld: 6.2 % — ABNORMAL HIGH (ref 4.8–5.6)

## 2023-05-02 MED ORDER — FAMOTIDINE 20 MG PO TABS: 20.0000 mg | ORAL_TABLET | Freq: Every day | ORAL | 1 refills | Status: DC

## 2023-05-02 NOTE — Assessment & Plan Note (Signed)
Previous labs reviewed, her A1c has been elevated in the past. I will check an A1c today. Reminded to avoid refined sugars including sugary drinks/foods and processed meats including bacon, sausages and deli meats.  

## 2023-05-02 NOTE — Assessment & Plan Note (Signed)
She is advised to stop eating 3 hrs prior to lying down. I will send rx famotidine to take daily as needed. She should also pay attention to her diet to determine which foods trigger her sx.

## 2023-05-02 NOTE — Assessment & Plan Note (Signed)
She is aware of 23lb weight gain since March 2024. She is encouraged to incorporate more exercise into her weekly routine, aiming for at least 150 minutes of exercise/week. She will consider resuming Wegovy.

## 2023-05-02 NOTE — Assessment & Plan Note (Signed)
Chronic, slight diastolic elevation noted. She will continue with lisinopril/hct 20/12.5mg  daily. She is encouraged to follow low sodium diet.

## 2023-05-02 NOTE — Progress Notes (Signed)
I,Victoria T Deloria Lair, CMA,acting as a Neurosurgeon for Gwynneth Aliment, MD.,have documented all relevant documentation on the behalf of Gwynneth Aliment, MD,as directed by  Gwynneth Aliment, MD while in the presence of Gwynneth Aliment, MD.  Subjective:  Patient ID: Jody [name] , female    DOB: 1971-08-14 , 52 y.o.   MRN: [id number]  Chief Complaint  Patient presents with   Hypertension   Anxiety    HPI  Pt is here today for HTN follow up. She reports compliance with meds.  She reports compliance with lisinopril/hctz without any issues. She denies headaches, chest pain and shortness of breath.   She reports experiencing heartburn for a while. Her sx occur when lying down. She admits she does not wait 3 hours prior to lying down after eating.  She has an appointment scheduled with cardiologist on next Monday.  Dr Servando Salina.      Hypertension This is a chronic problem. The current episode started more than 1 year ago. The problem is unchanged. The problem is controlled. Pertinent negatives include no chest pain, headaches, orthopnea, palpitations or shortness of breath. There are no associated agents to hypertension. Risk factors for coronary artery disease include obesity and sedentary lifestyle. The current treatment provides moderate improvement. Compliance problems include exercise.      Past Medical History:  Diagnosis Date   H/O amenorrhea    History of chicken pox    Hx of mumps    Hyperlipidemia    Hypertension    Low iron    Prediabetes    Yeast infection      Family History  Problem Relation Age of Onset   Hypertension Mother    Diabetes Mother    Hypertension Maternal Grandmother    Eczema Daughter      Current Outpatient Medications:    escitalopram (LEXAPRO) 10 MG tablet, TAKE 1 TABLET BY MOUTH EVERY DAY, Disp: 90 tablet, Rfl: 1   famotidine (PEPCID) 20 MG tablet, Take 1 tablet (20 mg total) by mouth daily. prn, Disp: 90 tablet, Rfl: 1   Ferrous Sulfate (IRON PO),  Take by mouth daily., Disp: , Rfl:    lisinopril-hydrochlorothiazide (ZESTORETIC) 20-12.5 MG tablet, Take 1 tablet by mouth daily., Disp: 90 tablet, Rfl: 2   Carboxymethylcellulose Sod PF (THERATEARS PF) 0.25 % SOLN, , Disp: , Rfl:    Dermatological Products, Misc. (EPICERAM) lotion, Apply 1 application. topically 2 (two) times daily. (Patient not taking: Reported on 05/05/2022), Disp: 90 g, Rfl: 5   triamcinolone ointment (KENALOG) 0.1 %, Apply 1 application. topically 2 (two) times daily. (Patient not taking: Reported on 12/30/2022), Disp: 454 g, Rfl: 0   Vitamin D, Ergocalciferol, (DRISDOL) 1.25 MG (50000 UNIT) CAPS capsule, Take 1 capsule (50,000 Units total) by mouth every 7 (seven) days. (Patient not taking: Reported on 12/30/2022), Disp: 12 capsule, Rfl: 0   No Known Allergies   Review of Systems  Constitutional: Negative.   Respiratory: Negative.  Negative for shortness of breath.   Cardiovascular: Negative.  Negative for chest pain, palpitations and orthopnea.  Gastrointestinal: Negative.   Skin: Negative.   Neurological: Negative.  Negative for headaches.  Psychiatric/Behavioral: Negative.       Today's Vitals   05/02/23 0907  BP: 110/82  Pulse: 77  Temp: 98.6 F (37 C)  SpO2: 98%  Weight: 231 lb 3.2 oz (104.9 kg)  Height: 5\' 8"  (1.727 m)   Body mass index is 35.15 kg/m.  Wt Readings from Last 3 Encounters:  05/02/23 231 lb 3.2 oz (104.9 kg)  12/01/22 208 lb 6.4 oz (94.5 kg)  11/09/22 219 lb (99.3 kg)    The 10-year ASCVD risk score (Arnett DK, et al., 2019) is: 1.2%   Values used to calculate the score:     Age: 11 years     Sex: Female     Is Non-Hispanic African American: Yes     Diabetic: No     Tobacco smoker: No     Systolic Blood Pressure: 110 mmHg     Is BP treated: Yes     HDL Cholesterol: 70 mg/dL     Total Cholesterol: 185 mg/dL   Objective:  Physical Exam Vitals and nursing note reviewed.  Constitutional:      Appearance: Normal appearance. She  is obese.  HENT:     Head: Normocephalic and atraumatic.  Eyes:     Extraocular Movements: Extraocular movements intact.  Cardiovascular:     Rate and Rhythm: Normal rate and regular rhythm.     Heart sounds: Normal heart sounds.  Pulmonary:     Effort: Pulmonary effort is normal.     Breath sounds: Normal breath sounds.  Musculoskeletal:     Cervical back: Normal range of motion.  Skin:    General: Skin is warm.  Neurological:     General: No focal deficit present.     Mental Status: She is alert.  Psychiatric:        Mood and Affect: Mood normal.        Behavior: Behavior normal.         Assessment And Plan:  Essential hypertension, benign Assessment & Plan: Chronic, slight diastolic elevation noted. She will continue with lisinopril/hct 20/12.5mg  daily. She is encouraged to follow low sodium diet.   Orders: -     CMP14+EGFR  Gastroesophageal reflux disease without esophagitis Assessment & Plan: She is advised to stop eating 3 hrs prior to lying down. I will send rx famotidine to take daily as needed. She should also pay attention to her diet to determine which foods trigger her sx.    Anxiety Assessment & Plan: Chronic, sx stable with escitalopram. Sx likely exacerbated by hormonal changes associated with perimenopause.   Other abnormal glucose Assessment & Plan: Previous labs reviewed, her A1c has been elevated in the past. I will check an A1c today. Reminded to avoid refined sugars including sugary drinks/foods and processed meats including bacon, sausages and deli meats.    Orders: -     CMP14+EGFR -     Hemoglobin A1c  Perimenopause Assessment & Plan: She is concerned about her heart health. No family history of heart disease. ASCVD risk is 1% based on Feb 2024 lipid panel. We discussed use of cardiac calcium scoring. She is aware of $99 cost. She also has upcoming Cardiology evaluation.   Orders: -     CT CARDIAC SCORING (SELF PAY ONLY);  Future  Class 2 severe obesity due to excess calories with serious comorbidity and body mass index (BMI) of 35.0 to 35.9 in adult [facility]) Assessment & Plan: She is aware of 23lb weight gain since March 2024. She is encouraged to incorporate more exercise into her weekly routine, aiming for at least 150 minutes of exercise/week. She will consider resuming Wegovy.    Other orders -     Famotidine; Take 1 tablet (20 mg total) by mouth daily. prn  Dispense: 90 tablet; Refill: 1  She is encouraged to strive for BMI less than  30 to decrease cardiac risk. Advised to aim for at least 150 minutes of exercise per week.    Return in 3 months (on 08/02/2023), or bp/weight check, for 6 MONTH BPC.  Patient was given opportunity to ask questions. Patient verbalized understanding of the plan and was able to repeat key elements of the plan. All questions were answered to their satisfaction.    I, Gwynneth Aliment, MD, have reviewed all documentation for this visit. The documentation on 05/02/23 for the exam, diagnosis, procedures, and orders are all accurate and complete.  IF YOU HAVE BEEN REFERRED TO A SPECIALIST, IT MAY TAKE 1-2 WEEKS TO SCHEDULE/PROCESS THE REFERRAL. IF YOU HAVE NOT HEARD FROM US/SPECIALIST IN TWO WEEKS, PLEASE GIVE [place] A CALL AT [phone].   THE PATIENT IS ENCOURAGED TO PRACTICE SOCIAL DISTANCING DUE TO THE COVID-19 PANDEMIC.

## 2023-05-02 NOTE — Assessment & Plan Note (Signed)
She is concerned about her heart health. No family history of heart disease. ASCVD risk is 1% based on Feb 2024 lipid panel. We discussed use of cardiac calcium scoring. She is aware of $99 cost. She also has upcoming Cardiology evaluation.

## 2023-05-02 NOTE — Assessment & Plan Note (Signed)
Chronic, sx stable with escitalopram. Sx likely exacerbated by hormonal changes associated with perimenopause.

## 2023-05-02 NOTE — Patient Instructions (Signed)
Hypertension, Adult Hypertension is another name for high blood pressure. High blood pressure forces your heart to work harder to pump blood. This can cause problems over time. There are two numbers in a blood pressure reading. There is a top number (systolic) over a bottom number (diastolic). It is best to have a blood pressure that is below 120/80. What are the causes? The cause of this condition is not known. Some other conditions can lead to high blood pressure. What increases the risk? Some lifestyle factors can make you more likely to develop high blood pressure: Smoking. Not getting enough exercise or physical activity. Being overweight. Having too much fat, sugar, calories, or salt (sodium) in your diet. Drinking too much alcohol. Other risk factors include: Having any of these conditions: Heart disease. Diabetes. High cholesterol. Kidney disease. Obstructive sleep apnea. Having a family history of high blood pressure and high cholesterol. Age. The risk increases with age. Stress. What are the signs or symptoms? High blood pressure may not cause symptoms. Very high blood pressure (hypertensive crisis) may cause: Headache. Fast or uneven heartbeats (palpitations). Shortness of breath. Nosebleed. Vomiting or feeling like you may vomit (nauseous). Changes in how you see. Very bad chest pain. Feeling dizzy. Seizures. How is this treated? This condition is treated by making healthy lifestyle changes, such as: Eating healthy foods. Exercising more. Drinking less alcohol. Your doctor may prescribe medicine if lifestyle changes do not help enough and if: Your top number is above 130. Your bottom number is above 80. Your personal target blood pressure may vary. Follow these instructions at home: Eating and drinking  If told, follow the DASH eating plan. To follow this plan: Fill one half of your plate at each meal with fruits and vegetables. Fill one fourth of your plate  at each meal with whole grains. Whole grains include whole-wheat pasta, brown rice, and whole-grain bread. Eat or drink low-fat dairy products, such as skim milk or low-fat yogurt. Fill one fourth of your plate at each meal with low-fat (lean) proteins. Low-fat proteins include fish, chicken without skin, eggs, beans, and tofu. Avoid fatty meat, cured and processed meat, or chicken with skin. Avoid pre-made or processed food. Limit the amount of salt in your diet to less than 1,500 mg each day. Do not drink alcohol if: Your doctor tells you not to drink. You are pregnant, may be pregnant, or are planning to become pregnant. If you drink alcohol: Limit how much you have to: 0-1 drink a day for women. 0-2 drinks a day for men. Know how much alcohol is in your drink. In the [place] one drink equals one 12 oz bottle of beer (355 mL), one 5 oz glass of wine (148 mL), or one 1 oz glass of hard liquor (44 mL). Lifestyle  Work with your doctor to stay at a healthy weight or to lose weight. Ask your doctor what the best weight is for you. Get at least 30 minutes of exercise that causes your heart to beat faster (aerobic exercise) most days of the week. This may include walking, swimming, or biking. Get at least 30 minutes of exercise that strengthens your muscles (resistance exercise) at least 3 days a week. This may include lifting weights or doing Pilates. Do not smoke or use any products that contain nicotine or tobacco. If you need help quitting, ask your doctor. Check your blood pressure at home as told by your doctor. Keep all follow-up visits. Medicines Take over-the-counter and prescription medicines   only as told by your doctor. Follow directions carefully. Do not skip doses of blood pressure medicine. The medicine does not work as well if you skip doses. Skipping doses also puts you at risk for problems. Ask your doctor about side effects or reactions to medicines that you should watch  for. Contact a doctor if: You think you are having a reaction to the medicine you are taking. You have headaches that keep coming back. You feel dizzy. You have swelling in your ankles. You have trouble with your vision. Get help right away if: You get a very bad headache. You start to feel mixed up (confused). You feel weak or numb. You feel faint. You have very bad pain in your: Chest. Belly (abdomen). You vomit more than once. You have trouble breathing. These symptoms may be an emergency. Get help right away. Call 911. Do not wait to see if the symptoms will go away. Do not drive yourself to the hospital. Summary Hypertension is another name for high blood pressure. High blood pressure forces your heart to work harder to pump blood. For most people, a normal blood pressure is less than 120/80. Making healthy choices can help lower blood pressure. If your blood pressure does not get lower with healthy choices, you may need to take medicine. This information is not intended to replace advice given to you by your health care provider. Make sure you discuss any questions you have with your health care provider. Document Revised: 06/18/2021 Document Reviewed: 06/18/2021 Elsevier Patient Education  2024 Elsevier Inc.  

## 2023-05-03 ENCOUNTER — Encounter: Payer: Self-pay | Admitting: Internal Medicine

## 2023-05-09 ENCOUNTER — Ambulatory Visit
Admission: RE | Admit: 2023-05-09 | Discharge: 2023-05-09 | Disposition: A | Discharge status: Home / Self Care | Payer: Self-pay | Source: Ambulatory Visit | Attending: Internal Medicine | Admitting: Internal Medicine

## 2023-05-09 ENCOUNTER — Ambulatory Visit (INDEPENDENT_AMBULATORY_CARE_PROVIDER_SITE_OTHER): Payer: Federal, State, Local not specified - PPO | Admitting: Cardiology

## 2023-05-09 ENCOUNTER — Ambulatory Visit (INDEPENDENT_AMBULATORY_CARE_PROVIDER_SITE_OTHER): Payer: Federal, State, Local not specified - PPO

## 2023-05-09 VITALS — BP 128/84 | HR 67 | Ht 68.0 in | Wt 236.6 lb

## 2023-05-09 DIAGNOSIS — R002 Palpitations: Secondary | ICD-10-CM

## 2023-05-09 DIAGNOSIS — N951 Menopausal and female climacteric states: Secondary | ICD-10-CM | POA: Insufficient documentation

## 2023-05-09 DIAGNOSIS — I10 Essential (primary) hypertension: Secondary | ICD-10-CM | POA: Insufficient documentation

## 2023-05-09 NOTE — Progress Notes (Unsigned)
Cardiology Office Note:    Date:  05/10/2023   ID:  Jody [name], DOB 03-Nov-1970, MRN [id number]  PCP:  Dorothyann Peng, MD  Cardiologist:  Thomasene Ripple, DO  Electrophysiologist:  None   Referring MD: Dorothyann Peng, MD   " I am having palpitations"   History of Present Illness:    Jody [name] is a 52 y.o. 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  She tells me that recently since menopause she has been experiencing significant intermittent palpitations.  Which she notes has somewhat been improving but still is there.  Thankfully she has not passed out.  No shortness of breath.  Past Medical History:  Diagnosis Date   H/O amenorrhea    History of chicken pox    Hx of mumps    Hyperlipidemia    Hypertension    Low iron    Prediabetes    Yeast infection     Past Surgical History:  Procedure Laterality Date   BREAST REDUCTION SURGERY  09/14/2007   bilateral   CESAREAN SECTION     LAPAROSCOPY Left 03/09/2013   Procedure: LAPAROSCOPY OPERATIVE, Bilateral Salpingectomy with Ectopic Pregnancy Left;  Surgeon: Hal Morales, MD;  Location: [facility];  Service: Gynecology;  Laterality: Left;   SALPINGECTOMY      Current Medications: Current Meds  Medication Sig   escitalopram (LEXAPRO) 10 MG tablet TAKE 1 TABLET BY MOUTH EVERY DAY   famotidine (PEPCID) 20 MG tablet Take 1 tablet (20 mg total) by mouth daily. prn   lisinopril-hydrochlorothiazide (ZESTORETIC) 20-12.5 MG tablet Take 1 tablet by mouth daily.   norethindrone-ethinyl estradiol (FEMHRT 1/5) 1-5 MG-MCG TABS tablet Take by mouth daily.     Allergies:   Patient has no known allergies.   Social History   Socioeconomic History   Marital status: Married    Spouse name: Not on file   Number of children: Not on file   Years of education: Not on file    Highest education level: Master's degree (e.g., MA, MS, MEng, MEd, MSW, MBA)  Occupational History   Occupation: Barrister's clerk  Tobacco Use   Smoking status: Never    Passive exposure: Never   Smokeless tobacco: Never  Vaping Use   Vaping status: Never Used  Substance and Sexual Activity   Alcohol use: Yes    Alcohol/week: 1.0 standard drink of alcohol    Types: 1 Standard drinks or equivalent per week   Drug use: No   Sexual activity: Yes    Birth control/protection: Surgical  Other Topics Concern   Not on file  Social History Narrative   Not on file   Social Determinants of Health   Financial Resource Strain: Low Risk  (12/01/2022)   Overall Financial Resource Strain (CARDIA)    Difficulty of Paying Living Expenses: Not hard at all  Food Insecurity: No Food Insecurity (12/01/2022)   Hunger Vital Sign    Worried About Running Out of Food in the Last Year: Never true    Ran Out of Food in the Last Year: Never true  Transportation Needs: No Transportation Needs (12/01/2022)   PRAPARE - Administrator, Civil Service (Medical): No    Lack of Transportation (Non-Medical): No  Physical Activity: Unknown (12/01/2022)   Exercise Vital Sign    Days of Exercise per Week: 0 days    Minutes of  Exercise per Session: Not on file  Stress: Stress Concern Present (12/01/2022)   [employer] of Occupational Health - Occupational Stress Questionnaire    Feeling of Stress : Rather much  Social Connections: Socially Integrated (12/01/2022)   Social Connection and Isolation Panel [NHANES]    Frequency of Communication with Friends and Family: More than three times a week    Frequency of Social Gatherings with Friends and Family: Three times a week    Attends Religious Services: More than 4 times per year    Active Member of Clubs or Organizations: Yes    Attends Engineer, structural: More than 4 times per year    Marital Status: Married     Family History: The  patient's family history includes Diabetes in her mother; Eczema in her daughter; Hypertension in her maternal grandmother and mother.  ROS:   Review of Systems  Constitution: Negative for decreased appetite, fever and weight gain.  HENT: Negative for congestion, ear discharge, hoarse voice and sore throat.   Eyes: Negative for discharge, redness, vision loss in right eye and visual halos.  Cardiovascular: Negative for chest pain, dyspnea on exertion, leg swelling, orthopnea and palpitations.  Respiratory: Negative for cough, hemoptysis, shortness of breath and snoring.   Endocrine: Negative for heat intolerance and polyphagia.  Hematologic/Lymphatic: Negative for bleeding problem. Does not bruise/bleed easily.  Skin: Negative for flushing, nail changes, rash and suspicious lesions.  Musculoskeletal: Negative for arthritis, joint pain, muscle cramps, myalgias, neck pain and stiffness.  Gastrointestinal: Negative for abdominal pain, bowel incontinence, diarrhea and excessive appetite.  Genitourinary: Negative for decreased libido, genital sores and incomplete emptying.  Neurological: Negative for brief paralysis, focal weakness, headaches and loss of balance.  Psychiatric/Behavioral: Negative for altered mental status, depression and suicidal ideas.  Allergic/Immunologic: Negative for HIV exposure and persistent infections.    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  Recent Labs: 12/02/2022: Hemoglobin 11.9; Platelets 449 02/22/2023: Magnesium 1.9; TSH 1.060 05/02/2023: ALT 12; BUN 13; Creatinine, Ser 0.81; Potassium 4.6; Sodium 140  Recent Lipid Panel    Component Value Date/Time   CHOL 185 11/02/2022 0851   TRIG 105 11/02/2022 0851   HDL 70 11/02/2022 0851   CHOLHDL 2.6 11/02/2022 0851   LDLCALC 96 11/02/2022  0851    Physical Exam:    VS:  BP 128/84   Pulse 67   Ht 5\' 8"  (1.727 m)   Wt 236 lb 9.6 oz (107.3 kg)   LMP  (Exact Date)   SpO2 98%   BMI 35.97 kg/m     Wt Readings from Last 3 Encounters:  05/09/23 236 lb 9.6 oz (107.3 kg)  05/02/23 231 lb 3.2 oz (104.9 kg)  12/01/22 208 lb 6.4 oz (94.5 kg)     GEN: Well nourished, well developed in no acute distress HEENT: Normal NECK: No JVD; No carotid bruits LYMPHATICS: No lymphadenopathy CARDIAC: S1S2 noted,RRR, no murmurs, rubs, gallops RESPIRATORY:  Clear to auscultation without rales, wheezing or rhonchi  ABDOMEN: Soft, non-tender, non-distended, +bowel sounds, no guarding. EXTREMITIES: No edema, No cyanosis, no clubbing MUSCULOSKELETAL:  No deformity  SKIN: Warm and dry NEUROLOGIC:  Alert and oriented x 3, non-focal PSYCHIATRIC:  Normal affect, good insight  ASSESSMENT:    1. Palpitations   2. Essential hypertension, benign    PLAN:  I would like to rule out a cardiovascular etiology of this palpitation, therefore at this time I would like to placed a zio patch for 14  days.   She had a calcium score done today which I was able to review with no evidence of coronary calcification - official report pending.   Blood pressure is acceptable, continue with current antihypertensive regimen.  Hyperlipidemia - continue with current statin medication. The patient is in agreement with the above plan. The patient left the office in stable condition.  The patient will follow up in   Medication Adjustments/Labs and Tests Ordered: Current medicines are reviewed at length with the patient today.  Concerns regarding medicines are outlined above.  Orders Placed This Encounter  Procedures   LONG TERM MONITOR (3-14 DAYS)   EKG 12-Lead   No orders of the defined types were placed in this encounter.   Patient Instructions  Medication Instructions:  No changes *If you need a refill on your cardiac medications before your next  appointment, please call your pharmacy*  Testing/Procedures: Your physician has recommended that you wear a 14 DAY ZIO-PATCH monitor. The Zio patch cardiac monitor continuously records heart rhythm data for up to 14 days, this is for patients being evaluated for multiple types heart rhythms. For the first 24 hours post application, please avoid getting the Zio monitor wet in the shower or by excessive sweating during exercise. After that, feel free to carry on with regular activities. Keep soaps and lotions away from the ZIO XT Patch.  This will be mailed to you, please expect 7-10 days to receive.    Applying the monitor   Shave hair from upper left chest.   Hold abrader disc by orange tab.  Rub abrader in 40 strokes over left upper chest as indicated in your monitor instructions.   Clean area with 4 enclosed alcohol pads .  Use all pads to assure are is cleaned thoroughly.  Let dry.   Apply patch as indicated in monitor instructions.  Patch will be place under collarbone on left side of chest with arrow pointing upward.   Rub patch adhesive wings for 2 minutes.Remove white label marked "1".  Remove white label marked "2".  Rub patch adhesive wings for 2 additional minutes.   While looking in a mirror, press and release button in center of patch.  A small green light will flash 3-4 times .  This will be your only indicator the monitor has been turned on.     Do not shower for the first 24 hours.  You may shower after the first 24 hours.   Press button if you feel a symptom. You will hear a small click.  Record Date, Time and Symptom in the Patient Log Book.   When you are ready to remove patch, follow instructions on last 2 pages of Patient Log Book.  Stick patch monitor onto last page of Patient Log Book.   Place Patient Log Book in [place] box.  Use locking tab on box and tape box closed securely.  The Orange and [employer] has [employer] on it.  Please place in mailbox as soon as  possible.  Your physician should have your test results approximately 7 days after the monitor has been mailed back to [facility].   Call [facility] Customer Care at [phone] if you have questions regarding your ZIO XT patch monitor.  Call them immediately if you see an orange light blinking on your monitor.  If your monitor falls off in less than 4 days contact our Monitor department at [phone].  If your monitor becomes loose or falls off after 4 days call [employer] at [phone] for suggestions on securing your monitor    Follow-Up: At [facility], you and your health needs are our priority.  As part of our continuing mission to provide you with exceptional heart care, we have created designated Provider Care Teams.  These Care Teams include your primary Cardiologist (physician) and Advanced Practice Providers (APPs -  Physician Assistants and Nurse Practitioners) who all work together to provide you with the care you need, when you need it.  We recommend signing up for the patient portal called "MyChart".  Sign up information is provided on this After Visit Summary.  MyChart is used to connect with patients for Virtual Visits (Telemedicine).  Patients are able to view lab/test results, encounter notes, upcoming appointments, etc.  Non-urgent messages can be sent to your provider as well.   To learn more about what you can do with MyChart, go to [email or web address].    Your next appointment:   12 week(s) (double book ok)  Provider:   Dr Servando Salina     Adopting a Healthy Lifestyle.  Know what a healthy weight is for you (roughly BMI <25) and aim to maintain this   Aim for 7+ servings of fruits and vegetables daily   65-80+ fluid ounces of water or unsweet tea for healthy kidneys   Limit to max 1 drink of alcohol per day; avoid smoking/tobacco   Limit animal fats in diet for cholesterol and heart health - choose grass fed whenever available   Avoid highly  processed foods, and foods high in saturated/trans fats   Aim for low stress - take time to unwind and care for your mental health   Aim for 150 min of moderate intensity exercise weekly for heart health, and weights twice weekly for bone health   Aim for 7-9 hours of sleep daily   When it comes to diets, agreement about the perfect plan isnt easy to find, even among the experts. Experts at the [facility] [employer] developed an idea known as the Healthy Eating Plate. Just imagine a plate divided into logical, healthy portions.   The emphasis is on diet quality:   Load up on vegetables and fruits - one-half of your plate: Aim for color and variety, and remember that potatoes dont count.   Go for whole grains - one-quarter of your plate: Whole wheat, barley, wheat berries, quinoa, oats, brown rice, and foods made with them. If you want pasta, go with whole wheat pasta.   Protein power - one-quarter of your plate: Fish, chicken, beans, and nuts are all healthy, versatile protein sources. Limit red meat.   The diet, however, does go beyond the plate, offering a few other suggestions.   Use healthy plant oils, such as olive, canola, soy, corn, sunflower and peanut. Check the labels, and avoid partially hydrogenated oil, which have unhealthy trans fats.   If youre thirsty, drink water. Coffee and tea are good in moderation, but skip sugary drinks and limit milk and dairy products to one or two daily servings.   The type of carbohydrate in the diet is more important than the amount. Some sources of carbohydrates, such as vegetables, fruits, whole grains, and beans-are healthier than others.   Finally, stay active  Osvaldo Shipper, DO  05/10/2023 5:16 PM    [facility]

## 2023-05-09 NOTE — Progress Notes (Unsigned)
Enrolled for [employer] to mail a ZIO XT long term holter monitor to the patients address on file.  

## 2023-05-09 NOTE — Patient Instructions (Signed)
Medication Instructions:  No changes *If you need a refill on your cardiac medications before your next appointment, please call your pharmacy*  Testing/Procedures: Your physician has recommended that you wear a 14 DAY ZIO-PATCH monitor. The Zio patch cardiac monitor continuously records heart rhythm data for up to 14 days, this is for patients being evaluated for multiple types heart rhythms. For the first 24 hours post application, please avoid getting the Zio monitor wet in the shower or by excessive sweating during exercise. After that, feel free to carry on with regular activities. Keep soaps and lotions away from the ZIO XT Patch.  This will be mailed to you, please expect 7-10 days to receive.    Applying the monitor   Shave hair from upper left chest.   Hold abrader disc by orange tab.  Rub abrader in 40 strokes over left upper chest as indicated in your monitor instructions.   Clean area with 4 enclosed alcohol pads .  Use all pads to assure are is cleaned thoroughly.  Let dry.   Apply patch as indicated in monitor instructions.  Patch will be place under collarbone on left side of chest with arrow pointing upward.   Rub patch adhesive wings for 2 minutes.Remove white label marked "1".  Remove white label marked "2".  Rub patch adhesive wings for 2 additional minutes.   While looking in a mirror, press and release button in center of patch.  A small green light will flash 3-4 times .  This will be your only indicator the monitor has been turned on.     Do not shower for the first 24 hours.  You may shower after the first 24 hours.   Press button if you feel a symptom. You will hear a small click.  Record Date, Time and Symptom in the Patient Log Book.   When you are ready to remove patch, follow instructions on last 2 pages of Patient Log Book.  Stick patch monitor onto last page of Patient Log Book.   Place Patient Log Book in [place] box.  Use locking tab on box and tape box  closed securely.  The [employer] and [employer] has [employer] on it.  Please place in mailbox as soon as possible.  Your physician should have your test results approximately 7 days after the monitor has been mailed back to [facility].   Call [facility] Customer Care at [phone] if you have questions regarding your ZIO XT patch monitor.  Call them immediately if you see an orange light blinking on your monitor.   If your monitor falls off in less than 4 days contact our Monitor department at [phone].  If your monitor becomes loose or falls off after 4 days call [employer] at [phone] for suggestions on securing your monitor    Follow-Up: At [facility], you and your health needs are our priority.  As part of our continuing mission to provide you with exceptional heart care, we have created designated Provider Care Teams.  These Care Teams include your primary Cardiologist (physician) and Advanced Practice Providers (APPs -  Physician Assistants and Nurse Practitioners) who all work together to provide you with the care you need, when you need it.  We recommend signing up for the patient portal called "MyChart".  Sign up information is provided on this After Visit Summary.  MyChart is used to connect with patients for Virtual Visits (Telemedicine).  Patients are able to view lab/test results, encounter notes, upcoming  appointments, etc.  Non-urgent messages can be sent to your provider as well.   To learn more about what you can do with MyChart, go to [email or web address].    Your next appointment:   12 week(s) (double book ok)  Provider:   Dr Servando Salina

## 2023-05-14 DIAGNOSIS — R002 Palpitations: Secondary | ICD-10-CM

## 2023-06-22 ENCOUNTER — Encounter: Payer: Self-pay | Admitting: Internal Medicine

## 2023-06-23 ENCOUNTER — Other Ambulatory Visit: Payer: Self-pay | Admitting: Internal Medicine

## 2023-06-23 MED ORDER — SEMAGLUTIDE (1 MG/DOSE) 4 MG/3ML ~~LOC~~ SOPN: 1.0000 mg | PEN_INJECTOR | SUBCUTANEOUS | 2 refills | Status: DC

## 2023-07-15 ENCOUNTER — Telehealth: Payer: Self-pay

## 2023-07-15 NOTE — Telephone Encounter (Signed)
PA sent to plan

## 2023-08-02 ENCOUNTER — Other Ambulatory Visit (HOSPITAL_BASED_OUTPATIENT_CLINIC_OR_DEPARTMENT_OTHER): Payer: Self-pay

## 2023-08-02 ENCOUNTER — Encounter: Payer: Self-pay | Admitting: Internal Medicine

## 2023-08-02 ENCOUNTER — Ambulatory Visit: Payer: Federal, State, Local not specified - PPO | Admitting: Internal Medicine

## 2023-08-02 VITALS — BP 120/72 | HR 74 | Temp 98.7°F | Ht 68.0 in | Wt 242.0 lb

## 2023-08-02 DIAGNOSIS — E6609 Other obesity due to excess calories: Secondary | ICD-10-CM | POA: Diagnosis not present

## 2023-08-02 DIAGNOSIS — I10 Essential (primary) hypertension: Secondary | ICD-10-CM

## 2023-08-02 DIAGNOSIS — E66812 Obesity, class 2: Secondary | ICD-10-CM | POA: Diagnosis not present

## 2023-08-02 DIAGNOSIS — Z6836 Body mass index (BMI) 36.0-36.9, adult: Secondary | ICD-10-CM

## 2023-08-02 DIAGNOSIS — R7309 Other abnormal glucose: Secondary | ICD-10-CM

## 2023-08-02 MED ORDER — SEMAGLUTIDE (1 MG/DOSE) 4 MG/3ML ~~LOC~~ SOPN
1.0000 mg | PEN_INJECTOR | SUBCUTANEOUS | 2 refills | Status: DC
  Filled 2023-08-02 – 2023-09-23 (×2): qty 3, 28d supply, fill #0

## 2023-08-02 NOTE — Progress Notes (Signed)
I,[name], CMA,acting as a Neurosurgeon for Jody Aliment, MD.,have documented all relevant documentation on the behalf of Jody Aliment, MD,as directed by  Jody Aliment, MD while in the presence of Jody Aliment, MD.  Subjective:  Patient ID: Jody [name] , female    DOB: 02-18-1971 , 52 y.o.   MRN: [id number]  Chief Complaint  Patient presents with   Hypertension    HPI  Pt is here today for HTN/weight follow up. She reports compliance with meds.  She reports compliance with lisinopril/hctz without any issues. She denies headaches, chest pain and shortness of breath.     Hypertension This is a chronic problem. The current episode started more than 1 year ago. The problem is unchanged. The problem is controlled. Pertinent negatives include no chest pain, headaches, orthopnea, palpitations or shortness of breath. There are no associated agents to hypertension. Risk factors for coronary artery disease include obesity and sedentary lifestyle. The current treatment provides moderate improvement. Compliance problems include exercise.      Past Medical History:  Diagnosis Date   H/O amenorrhea    History of chicken pox    Hx of mumps    Hyperlipidemia    Hypertension    Low iron    Prediabetes    Yeast infection      Family History  Problem Relation Age of Onset   Hypertension Mother    Diabetes Mother    Hypertension Maternal Grandmother    Eczema Daughter      Current Outpatient Medications:    escitalopram (LEXAPRO) 10 MG tablet, TAKE 1 TABLET BY MOUTH EVERY DAY, Disp: 90 tablet, Rfl: 1   famotidine (PEPCID) 20 MG tablet, Take 1 tablet (20 mg total) by mouth daily. prn, Disp: 90 tablet, Rfl: 1   lisinopril-hydrochlorothiazide (ZESTORETIC) 20-12.5 MG tablet, Take 1 tablet by mouth daily., Disp: 90 tablet, Rfl: 2   norethindrone-ethinyl estradiol (FEMHRT 1/5) 1-5 MG-MCG TABS tablet, Take by mouth daily., Disp: , Rfl:    Semaglutide, 1 MG/DOSE, 4 MG/3ML  SOPN, Inject 1 mg into the skin once a week., Disp: 3 mL, Rfl: 2   No Known Allergies   Review of Systems  Constitutional: Negative.   Eyes: Negative.   Respiratory: Negative.  Negative for shortness of breath.   Cardiovascular: Negative.  Negative for chest pain, palpitations and orthopnea.  Gastrointestinal: Negative.   Endocrine: Negative for polydipsia, polyphagia and polyuria.  Musculoskeletal: Negative.   Skin: Negative.   Neurological:  Negative for headaches.  Psychiatric/Behavioral: Negative.       Today's Vitals   08/02/23 1601  BP: 120/72  Pulse: 74  Temp: 98.7 F (37.1 C)  Weight: 242 lb (109.8 kg)  Height: 5\' 8"  (1.727 m)  PainSc: 0-No pain   Body mass index is 36.8 kg/m.  Wt Readings from Last 3 Encounters:  08/03/23 242 lb (109.8 kg)  08/02/23 242 lb (109.8 kg)  05/09/23 236 lb 9.6 oz (107.3 kg)    The 10-year ASCVD risk score (Arnett DK, et al., 2019) is: 2.6%   Values used to calculate the score:     Age: 19 years     Sex: Female     Is Non-Hispanic African American: Yes     Diabetic: No     Tobacco smoker: No     Systolic Blood Pressure: 125 mmHg     Is BP treated: Yes     HDL Cholesterol: 70 mg/dL     Total Cholesterol:  212 mg/dL  Objective:  Physical Exam Vitals and nursing note reviewed.  Constitutional:      Appearance: Normal appearance. She is obese.  HENT:     Head: Normocephalic and atraumatic.  Eyes:     Extraocular Movements: Extraocular movements intact.  Cardiovascular:     Rate and Rhythm: Normal rate and regular rhythm.     Heart sounds: Normal heart sounds.  Pulmonary:     Effort: Pulmonary effort is normal.     Breath sounds: Normal breath sounds.  Musculoskeletal:     Cervical back: Normal range of motion.  Skin:    General: Skin is warm.  Neurological:     General: No focal deficit present.     Mental Status: She is alert.  Psychiatric:        Mood and Affect: Mood normal.        Behavior: Behavior normal.          Assessment And Plan:  Essential hypertension, benign Assessment & Plan: Chronic, controlled.  She will continue with lisinopril/hct 20/12.5mg  daily. She is encouraged to follow low sodium diet.    Other abnormal glucose Assessment & Plan: Previous labs reviewed, her A1c has been elevated in the past. I will check an A1c today. Reminded to avoid refined sugars including sugary drinks/foods and processed meats including bacon, sausages and deli meats.    Orders: -     Hemoglobin A1c  Class 2 obesity due to excess calories without serious comorbidity with body mass index (BMI) of 36.0 to 36.9 in adult Assessment & Plan: She is aware of weight gain. Importance of regular exercise was discussed with the patient. I will send rx semaglutide 1mg  weekly. She is encouraged to be intentional about her protein intake to decrease risk of losing muscle mass.    Other orders -     Semaglutide (1 MG/DOSE); Inject 1 mg into the skin once a week.  Dispense: 3 mL; Refill: 2    Return if symptoms worsen or fail to improve.  Patient was given opportunity to ask questions. Patient verbalized understanding of the plan and was able to repeat key elements of the plan. All questions were answered to their satisfaction.    I, Jody Aliment, MD, have reviewed all documentation for this visit. The documentation on 08/02/23 for the exam, diagnosis, procedures, and orders are all accurate and complete.   IF YOU HAVE BEEN REFERRED TO A SPECIALIST, IT MAY TAKE 1-2 WEEKS TO SCHEDULE/PROCESS THE REFERRAL. IF YOU HAVE NOT HEARD FROM US/SPECIALIST IN TWO WEEKS, PLEASE GIVE [place] A CALL AT [phone].

## 2023-08-03 ENCOUNTER — Ambulatory Visit: Payer: Federal, State, Local not specified - PPO | Attending: Cardiovascular Disease | Admitting: Cardiology

## 2023-08-03 ENCOUNTER — Telehealth: Payer: Self-pay

## 2023-08-03 ENCOUNTER — Encounter: Payer: Self-pay | Admitting: Cardiology

## 2023-08-03 VITALS — BP 125/54 | HR 65 | Ht 69.0 in | Wt 242.0 lb

## 2023-08-03 DIAGNOSIS — E785 Hyperlipidemia, unspecified: Secondary | ICD-10-CM

## 2023-08-03 DIAGNOSIS — E782 Mixed hyperlipidemia: Secondary | ICD-10-CM

## 2023-08-03 DIAGNOSIS — I10 Essential (primary) hypertension: Secondary | ICD-10-CM | POA: Diagnosis not present

## 2023-08-03 DIAGNOSIS — R7303 Prediabetes: Secondary | ICD-10-CM | POA: Diagnosis not present

## 2023-08-03 DIAGNOSIS — I471 Supraventricular tachycardia, unspecified: Secondary | ICD-10-CM

## 2023-08-03 LAB — HEMOGLOBIN A1C
Est. average glucose Bld gHb Est-mCnc: 131 mg/dL
Hgb A1c MFr Bld: 6.2 % — ABNORMAL HIGH (ref 4.8–5.6)

## 2023-08-03 NOTE — Progress Notes (Signed)
Virtual Visit via Video Note   Because of Jody [name]'s co-morbid illnesses, she is at least at moderate risk for complications without adequate follow up.  This format is felt to be most appropriate for this patient at this time.  All issues noted in this document were discussed and addressed.  A limited physical exam was performed with this format.  Please refer to the patient's chart for her consent to telehealth for [facility].      Date:  08/03/2023   ID:  VIVIE GEDDES, DOB July 13, 1971, MRN [id number]  Patient Location: Home Provider Location: Office/Clinic  PCP:  Dorothyann Peng, MD  Cardiologist:  Thomasene Ripple, DO  Electrophysiologist:  None   Evaluation Performed:  Follow-Up Visit  Chief Complaint:  " I am ok"  History of Present Illness:    Jody [name] is a 52 y.o. female with prediabetes, hyperlipidemia, hypertension with recent episodes of PSVT on ZIO monitor here today for follow-up visit.  At her last visit you experiencing palpitations I discussed with the patient about a ZIO monitor and she was agreeable.  She is here today to discuss her testing result.  The patient does not have symptoms concerning for COVID-19 infection (fever, chills, cough, or new shortness of breath).    Past Medical History:  Diagnosis Date   H/O amenorrhea    History of chicken pox    Hx of mumps    Hyperlipidemia    Hypertension    Low iron    Prediabetes    Yeast infection    Past Surgical History:  Procedure Laterality Date   BREAST REDUCTION SURGERY  09/14/2007   bilateral   CESAREAN SECTION     LAPAROSCOPY Left 03/09/2013   Procedure: LAPAROSCOPY OPERATIVE, Bilateral Salpingectomy with Ectopic Pregnancy Left;  Surgeon: Hal Morales, MD;  Location: [facility];  Service: Gynecology;  Laterality: Left;   SALPINGECTOMY       Current Meds  Medication Sig   escitalopram (LEXAPRO) 10 MG tablet TAKE 1 TABLET BY MOUTH EVERY DAY   famotidine  (PEPCID) 20 MG tablet Take 1 tablet (20 mg total) by mouth daily. prn   lisinopril-hydrochlorothiazide (ZESTORETIC) 20-12.5 MG tablet Take 1 tablet by mouth daily.   norethindrone-ethinyl estradiol (FEMHRT 1/5) 1-5 MG-MCG TABS tablet Take by mouth daily.   Semaglutide, 1 MG/DOSE, 4 MG/3ML SOPN Inject 1 mg into the skin once a week.     Allergies:   Patient has no known allergies.   Social History   Tobacco Use   Smoking status: Never    Passive exposure: Never   Smokeless tobacco: Never  Vaping Use   Vaping status: Never Used  Substance Use Topics   Alcohol use: Yes    Alcohol/week: 1.0 standard drink of alcohol    Types: 1 Standard drinks or equivalent per week   Drug use: No     Family Hx: The patient's family history includes Diabetes in her mother; Eczema in her daughter; Hypertension in her maternal grandmother and mother.  ROS:   Please see the history of present illness.     All other systems reviewed and are negative.   Prior CV studies:   The following studies were reviewed today:  Discussed ZIO monitor and coronary calcium score  Labs/Other Tests and Data Reviewed:    EKG:  No ECG reviewed.  Recent Labs: 12/02/2022: Hemoglobin 11.9; Platelets 449 02/22/2023: Magnesium 1.9; TSH 1.060 05/02/2023: ALT 12; BUN 13; Creatinine, Ser 0.81; Potassium  4.6; Sodium 140   Recent Lipid Panel Lab Results  Component Value Date/Time   CHOL 185 11/02/2022 08:51 AM   TRIG 105 11/02/2022 08:51 AM   HDL 70 11/02/2022 08:51 AM   CHOLHDL 2.6 11/02/2022 08:51 AM   LDLCALC 96 11/02/2022 08:51 AM    Wt Readings from Last 3 Encounters:  08/03/23 242 lb (109.8 kg)  08/02/23 242 lb (109.8 kg)  05/09/23 236 lb 9.6 oz (107.3 kg)     Objective:    Vital Signs:  BP (!) 125/54 (BP Location: Left Arm, Patient Position: Sitting, Cuff Size: Normal)   Pulse 65   Ht 5\' 9"  (1.753 m)   Wt 242 lb (109.8 kg)   LMP 07/18/2023   BMI 35.74 kg/m      ASSESSMENT & PLAN:     Prediabetes Hyperlipidemia Hypertension PSVT  She is doing well from a CV standpoint . No change in medication.   COVID-19 Education: The signs and symptoms of COVID-19 were discussed with the patient and how to seek care for testing (follow up with PCP or arrange E-visit).  The importance of social distancing was discussed today.  Time:   Today, I have spent 12 minutes with the patient with telehealth technology discussing the above problems.     Medication Adjustments/Labs and Tests Ordered: Current medicines are reviewed at length with the patient today.  Concerns regarding medicines are outlined above.   Tests Ordered: No orders of the defined types were placed in this encounter.   Medication Changes: No orders of the defined types were placed in this encounter.   Follow Up:  In Person in 1 year(s)  Signed, Thomasene Ripple, DO  08/03/2023 9:22 AM    [facility]

## 2023-08-03 NOTE — Patient Instructions (Signed)
 Medication Instructions:  Your physician recommends that you continue on your current medications as directed. Please refer to the Current Medication list given to you today.  *If you need a refill on your cardiac medications before your next appointment, please call your pharmacy*   Follow-Up: At [facility], you and your health needs are our priority.  As part of our continuing mission to provide you with exceptional heart care, we have created designated Provider Care Teams.  These Care Teams include your primary Cardiologist (physician) and Advanced Practice Providers (APPs -  Physician Assistants and Nurse Practitioners) who all work together to provide you with the care you need, when you need it.   Your next appointment:   1 year(s)  Provider:   Thomasene Ripple, DO

## 2023-08-03 NOTE — Telephone Encounter (Signed)
  Patient Consent for Virtual Visit        Jody [name] has provided verbal consent on 08/03/2023 for a virtual visit (video or telephone).   CONSENT FOR VIRTUAL VISIT FOR:  [name]  By participating in this virtual visit I agree to the following:  I hereby voluntarily request, consent and authorize [facility] and its employed or contracted physicians, physician assistants, nurse practitioners or other licensed health care professionals (the Practitioner), to provide me with telemedicine health care services (the "Services") as deemed necessary by the treating Practitioner. I acknowledge and consent to receive the Services by the Practitioner via telemedicine. I understand that the telemedicine visit will involve communicating with the Practitioner through live audiovisual communication technology and the disclosure of certain medical information by electronic transmission. I acknowledge that I have been given the opportunity to request an in-person assessment or other available alternative prior to the telemedicine visit and am voluntarily participating in the telemedicine visit.  I understand that I have the right to withhold or withdraw my consent to the use of telemedicine in the course of my care at any time, without affecting my right to future care or treatment, and that the Practitioner or I may terminate the telemedicine visit at any time. I understand that I have the right to inspect all information obtained and/or recorded in the course of the telemedicine visit and may receive copies of available information for a reasonable fee.  I understand that some of the potential risks of receiving the Services via telemedicine include:  Delay or interruption in medical evaluation due to technological equipment failure or disruption; Information transmitted may not be sufficient (e.g. poor resolution of images) to allow for appropriate medical decision making by the  Practitioner; and/or  In rare instances, security protocols could fail, causing a breach of personal health information.  Furthermore, I acknowledge that it is my responsibility to provide information about my medical history, conditions and care that is complete and accurate to the best of my ability. I acknowledge that Practitioner's advice, recommendations, and/or decision may be based on factors not within their control, such as incomplete or inaccurate data provided by me or distortions of diagnostic images or specimens that may result from electronic transmissions. I understand that the practice of medicine is not an exact science and that Practitioner makes no warranties or guarantees regarding treatment outcomes. I acknowledge that a copy of this consent can be made available to me via my patient portal [facility] MyChart), or I can request a printed copy by calling the office of [facility].    I understand that my insurance will be billed for this visit.   I have read or had this consent read to me. I understand the contents of this consent, which adequately explains the benefits and risks of the Services being provided via telemedicine.  I have been provided ample opportunity to ask questions regarding this consent and the Services and have had my questions answered to my satisfaction. I give my informed consent for the services to be provided through the use of telemedicine in my medical care

## 2023-08-05 NOTE — Assessment & Plan Note (Signed)
Chronic, controlled.  She will continue with lisinopril/hct 20/12.5mg  daily. She is encouraged to follow low sodium diet.

## 2023-08-05 NOTE — Assessment & Plan Note (Signed)
She is aware of weight gain. Importance of regular exercise was discussed with the patient. I will send rx semaglutide 1mg  weekly. She is encouraged to be intentional about her protein intake to decrease risk of losing muscle mass.

## 2023-08-05 NOTE — Assessment & Plan Note (Signed)
Previous labs reviewed, her A1c has been elevated in the past. I will check an A1c today. Reminded to avoid refined sugars including sugary drinks/foods and processed meats including bacon, sausages and deli meats.  

## 2023-08-06 ENCOUNTER — Other Ambulatory Visit (HOSPITAL_BASED_OUTPATIENT_CLINIC_OR_DEPARTMENT_OTHER): Payer: Self-pay

## 2023-08-08 ENCOUNTER — Other Ambulatory Visit (HOSPITAL_BASED_OUTPATIENT_CLINIC_OR_DEPARTMENT_OTHER): Payer: Self-pay

## 2023-08-17 ENCOUNTER — Other Ambulatory Visit (HOSPITAL_BASED_OUTPATIENT_CLINIC_OR_DEPARTMENT_OTHER): Payer: Self-pay

## 2023-09-04 ENCOUNTER — Other Ambulatory Visit (HOSPITAL_BASED_OUTPATIENT_CLINIC_OR_DEPARTMENT_OTHER): Payer: Self-pay

## 2023-09-05 ENCOUNTER — Other Ambulatory Visit (HOSPITAL_BASED_OUTPATIENT_CLINIC_OR_DEPARTMENT_OTHER): Payer: Self-pay

## 2023-09-23 ENCOUNTER — Other Ambulatory Visit (HOSPITAL_BASED_OUTPATIENT_CLINIC_OR_DEPARTMENT_OTHER): Payer: Self-pay

## 2023-10-03 ENCOUNTER — Other Ambulatory Visit (HOSPITAL_BASED_OUTPATIENT_CLINIC_OR_DEPARTMENT_OTHER): Payer: Self-pay

## 2023-10-05 ENCOUNTER — Other Ambulatory Visit (HOSPITAL_BASED_OUTPATIENT_CLINIC_OR_DEPARTMENT_OTHER): Payer: Self-pay

## 2023-10-15 ENCOUNTER — Other Ambulatory Visit (HOSPITAL_BASED_OUTPATIENT_CLINIC_OR_DEPARTMENT_OTHER): Payer: Self-pay

## 2023-10-16 ENCOUNTER — Other Ambulatory Visit (HOSPITAL_BASED_OUTPATIENT_CLINIC_OR_DEPARTMENT_OTHER): Payer: Self-pay

## 2023-10-21 ENCOUNTER — Other Ambulatory Visit (HOSPITAL_BASED_OUTPATIENT_CLINIC_OR_DEPARTMENT_OTHER): Payer: Self-pay

## 2023-10-22 ENCOUNTER — Other Ambulatory Visit (HOSPITAL_BASED_OUTPATIENT_CLINIC_OR_DEPARTMENT_OTHER): Payer: Self-pay

## 2023-10-27 ENCOUNTER — Other Ambulatory Visit: Payer: Self-pay | Admitting: Internal Medicine

## 2023-10-27 ENCOUNTER — Other Ambulatory Visit (HOSPITAL_BASED_OUTPATIENT_CLINIC_OR_DEPARTMENT_OTHER): Payer: Self-pay

## 2023-11-16 ENCOUNTER — Ambulatory Visit (INDEPENDENT_AMBULATORY_CARE_PROVIDER_SITE_OTHER): Payer: Self-pay | Admitting: Internal Medicine

## 2023-11-16 ENCOUNTER — Encounter: Payer: Self-pay | Admitting: Internal Medicine

## 2023-11-16 ENCOUNTER — Other Ambulatory Visit: Payer: Self-pay | Admitting: Internal Medicine

## 2023-11-16 VITALS — BP 122/80 | HR 72 | Temp 98.8°F | Ht 69.0 in | Wt 246.2 lb

## 2023-11-16 DIAGNOSIS — F419 Anxiety disorder, unspecified: Secondary | ICD-10-CM | POA: Diagnosis not present

## 2023-11-16 DIAGNOSIS — E66812 Obesity, class 2: Secondary | ICD-10-CM

## 2023-11-16 DIAGNOSIS — K219 Gastro-esophageal reflux disease without esophagitis: Secondary | ICD-10-CM | POA: Diagnosis not present

## 2023-11-16 DIAGNOSIS — Z0000 Encounter for general adult medical examination without abnormal findings: Secondary | ICD-10-CM

## 2023-11-16 DIAGNOSIS — R7309 Other abnormal glucose: Secondary | ICD-10-CM | POA: Diagnosis not present

## 2023-11-16 DIAGNOSIS — Z6836 Body mass index (BMI) 36.0-36.9, adult: Secondary | ICD-10-CM

## 2023-11-16 DIAGNOSIS — I10 Essential (primary) hypertension: Secondary | ICD-10-CM

## 2023-11-16 DIAGNOSIS — E6609 Other obesity due to excess calories: Secondary | ICD-10-CM

## 2023-11-16 DIAGNOSIS — M722 Plantar fascial fibromatosis: Secondary | ICD-10-CM

## 2023-11-16 LAB — POCT URINALYSIS DIPSTICK
Bilirubin, UA: NEGATIVE
Glucose, UA: NEGATIVE
Ketones, UA: NEGATIVE
Leukocytes, UA: NEGATIVE
Nitrite, UA: NEGATIVE
Protein, UA: NEGATIVE
Spec Grav, UA: 1.02 (ref 1.010–1.025)
Urobilinogen, UA: 0.2 U/dL
pH, UA: 6 (ref 5.0–8.0)

## 2023-11-16 MED ORDER — ESCITALOPRAM OXALATE 10 MG PO TABS: 10.0000 mg | ORAL_TABLET | Freq: Every day | ORAL | 1 refills | Status: DC

## 2023-11-16 MED ORDER — LISINOPRIL-HYDROCHLOROTHIAZIDE 20-12.5 MG PO TABS: 1.0000 | ORAL_TABLET | Freq: Every day | ORAL | 2 refills | Status: DC

## 2023-11-16 MED ORDER — WEGOVY 0.5 MG/0.5ML ~~LOC~~ SOAJ
0.5000 mg | SUBCUTANEOUS | 0 refills | Status: DC
Start: 2023-11-16 — End: 2023-11-30

## 2023-11-16 NOTE — Progress Notes (Unsigned)
 I,Victoria T Deloria Lair, CMA,acting as a Neurosurgeon for Gwynneth Aliment, MD.,have documented all relevant documentation on the behalf of Gwynneth Aliment, MD,as directed by  Gwynneth Aliment, MD while in the presence of Gwynneth Aliment, MD.  Subjective:    Patient ID: Jody [name] , female    DOB: December 16, 1970 , 53 y.o.   MRN: [id number]  Chief Complaint  Patient presents with  . Annual Exam  . Hypertension    HPI  Pt is here today for physical exam. She is followed by Dr. Normand Sloop for her GYN exams.  She reports compliance with lisinopril/hctz without any issues. She denies headaches, chest pain and shortness of breath.     She states the [place] has declared her 100% disabled.  She needs handicapped sticker. .    Hypertension This is a chronic problem. The current episode started more than 1 year ago. The problem is unchanged. The problem is controlled. Pertinent negatives include no chest pain, headaches, orthopnea, palpitations or shortness of breath. There are no associated agents to hypertension. Risk factors for coronary artery disease include obesity and sedentary lifestyle. The current treatment provides moderate improvement. Compliance problems include exercise.      Past Medical History:  Diagnosis Date  . H/O amenorrhea   . History of chicken pox   . Hx of mumps   . Hyperlipidemia   . Hypertension   . Low iron   . Prediabetes   . Yeast infection      Family History  Problem Relation Age of Onset  . Hypertension Mother   . Diabetes Mother   . Hypertension Maternal Grandmother   . Eczema Daughter      Current Outpatient Medications:  .  escitalopram (LEXAPRO) 10 MG tablet, TAKE 1 TABLET BY MOUTH EVERY DAY, Disp: 90 tablet, Rfl: 1 .  famotidine (PEPCID) 20 MG tablet, TAKE 1 TABLET BY MOUTH EVERY DAY AS NEEDED, Disp: 90 tablet, Rfl: 1 .  lisinopril-hydrochlorothiazide (ZESTORETIC) 20-12.5 MG tablet, Take 1 tablet by mouth daily., Disp: 90 tablet, Rfl: 2 .   norethindrone-ethinyl estradiol (FEMHRT 1/5) 1-5 MG-MCG TABS tablet, Take by mouth daily., Disp: , Rfl:  .  Semaglutide, 1 MG/DOSE, 4 MG/3ML SOPN, Inject 1 mg into the skin once a week. (Patient not taking: Reported on 11/16/2023), Disp: 3 mL, Rfl: 2   No Known Allergies    The patient states she uses {contraceptive methods:5051} for birth control. Patient's last menstrual period was 11/14/2023 (exact date).. Negative for Dysmenorrhea. Negative for: breast discharge, breast lump(s), breast pain and breast self exam. Associated symptoms include abnormal vaginal bleeding. Pertinent negatives include abnormal bleeding (hematology), anxiety, decreased libido, depression, difficulty falling sleep, dyspareunia, history of infertility, nocturia, sexual dysfunction, sleep disturbances, urinary incontinence, urinary urgency, vaginal discharge and vaginal itching. Diet regular.The patient states her exercise level is  moderate.  . The patient's tobacco use is:  Social History   Tobacco Use  Smoking Status Never  . Passive exposure: Never  Smokeless Tobacco Never  . She has been exposed to passive smoke. The patient's alcohol use is:  Social History   Substance and Sexual Activity  Alcohol Use Yes  . Alcohol/week: 1.0 standard drink of alcohol  . Types: 1 Standard drinks or equivalent per week    Review of Systems  Constitutional: Negative.   HENT: Negative.    Eyes: Negative.   Respiratory: Negative.  Negative for shortness of breath.   Cardiovascular: Negative.  Negative for chest pain, palpitations and  orthopnea.  Gastrointestinal: Negative.   Endocrine: Negative.   Genitourinary: Negative.   Musculoskeletal: Negative.   Skin: Negative.   Allergic/Immunologic: Negative.   Neurological: Negative.  Negative for headaches.  Hematological: Negative.   Psychiatric/Behavioral: Negative.       Today's Vitals   11/16/23 0949  BP: 122/80  Pulse: 72  Temp: 98.8 F (37.1 C)  SpO2: 98%   Weight: 246 lb 3.2 oz (111.7 kg)  Height: 5\' 9"  (1.753 m)   Body mass index is 36.36 kg/m.  Wt Readings from Last 3 Encounters:  11/16/23 246 lb 3.2 oz (111.7 kg)  08/03/23 242 lb (109.8 kg)  08/02/23 242 lb (109.8 kg)     Objective:  Physical Exam Vitals and nursing note reviewed.  Constitutional:      Appearance: Normal appearance.  HENT:     Head: Normocephalic and atraumatic.     Right Ear: Tympanic membrane, ear canal and external ear normal.     Left Ear: Tympanic membrane, ear canal and external ear normal.     Nose:     Comments: Masked     Mouth/Throat:     Comments: Masked  Eyes:     Extraocular Movements: Extraocular movements intact.     Conjunctiva/sclera: Conjunctivae normal.     Pupils: Pupils are equal, round, and reactive to light.  Cardiovascular:     Rate and Rhythm: Normal rate and regular rhythm.     Pulses: Normal pulses.     Heart sounds: Normal heart sounds.  Pulmonary:     Effort: Pulmonary effort is normal.     Breath sounds: Normal breath sounds.  Chest:  Breasts:    Tanner Score is 5.     Right: Normal.     Left: Normal.     Comments: Healed surgical scars breasts Abdominal:     General: Abdomen is flat. Bowel sounds are normal.     Palpations: Abdomen is soft.  Genitourinary:    Comments: deferred Musculoskeletal:        General: Normal range of motion.     Cervical back: Normal range of motion and neck supple.  Skin:    General: Skin is warm and dry.  Neurological:     General: No focal deficit present.     Mental Status: She is alert and oriented to person, place, and time.  Psychiatric:        Mood and Affect: Mood normal.        Behavior: Behavior normal.        Assessment And Plan:     Encounter for general adult medical examination w/o abnormal findings -     CBC -     CMP14+EGFR -     Lipid panel  Essential hypertension, benign -     POCT urinalysis dipstick -     Microalbumin / creatinine urine ratio -      EKG 12-Lead  Other abnormal glucose -     Hemoglobin A1c  Anxiety  Gastroesophageal reflux disease without esophagitis  Class 2 obesity due to excess calories without serious comorbidity with body mass index (BMI) of 36.0 to 36.9 in adult  She is encouraged to strive for BMI less than 30 to decrease cardiac risk. Advised to aim for at least 150 minutes of exercise per week.    Return for 1 YEAR HM, 6 month bpc. Patient was given opportunity to ask questions. Patient verbalized understanding of the plan and was able to repeat key elements of  the plan. All questions were answered to their satisfaction.   I, Gwynneth Aliment, MD, have reviewed all documentation for this visit. The documentation on 11/16/23 for the exam, diagnosis, procedures, and orders are all accurate and complete.

## 2023-11-16 NOTE — Patient Instructions (Signed)

## 2023-11-16 NOTE — Assessment & Plan Note (Signed)

## 2023-11-17 ENCOUNTER — Encounter: Payer: Self-pay | Admitting: Internal Medicine

## 2023-11-17 DIAGNOSIS — M722 Plantar fascial fibromatosis: Secondary | ICD-10-CM | POA: Insufficient documentation

## 2023-11-17 LAB — CMP14+EGFR
ALT: 11 IU/L (ref 0–32)
AST: 14 IU/L (ref 0–40)
Albumin: 4.4 g/dL (ref 3.8–4.9)
Alkaline Phosphatase: 98 IU/L (ref 44–121)
BUN/Creatinine Ratio: 14 (ref 9–23)
BUN: 12 mg/dL (ref 6–24)
Bilirubin Total: 0.2 mg/dL (ref 0.0–1.2)
CO2: 22 mmol/L (ref 20–29)
Calcium: 10.3 mg/dL — ABNORMAL HIGH (ref 8.7–10.2)
Chloride: 103 mmol/L (ref 96–106)
Creatinine, Ser: 0.88 mg/dL (ref 0.57–1.00)
Globulin, Total: 3.5 g/dL (ref 1.5–4.5)
Glucose: 90 mg/dL (ref 70–99)
Potassium: 4.3 mmol/L (ref 3.5–5.2)
Sodium: 140 mmol/L (ref 134–144)
Total Protein: 7.9 g/dL (ref 6.0–8.5)
eGFR: 80 mL/min/{1.73_m2} (ref >59)

## 2023-11-17 LAB — HEMOGLOBIN A1C
Est. average glucose Bld gHb Est-mCnc: 137 mg/dL
Hgb A1c MFr Bld: 6.4 % — ABNORMAL HIGH (ref 4.8–5.6)

## 2023-11-17 LAB — CBC
Hematocrit: 39.3 % (ref 34.0–46.6)
Hemoglobin: 12.5 g/dL (ref 11.1–15.9)
MCH: 25.7 pg — ABNORMAL LOW (ref 26.6–33.0)
MCHC: 31.8 g/dL (ref 31.5–35.7)
MCV: 81 fL (ref 79–97)
Platelets: 447 10*3/uL (ref 150–450)
RBC: 4.86 x10E6/uL (ref 3.77–5.28)
RDW: 12.6 % (ref 11.7–15.4)
WBC: 8.1 10*3/uL (ref 3.4–10.8)

## 2023-11-17 LAB — MICROALBUMIN / CREATININE URINE RATIO
Creatinine, Urine: 102 mg/dL
Microalb/Creat Ratio: 15 mg/g{creat} (ref 0–29)
Microalbumin, Urine: 15.3 ug/mL

## 2023-11-17 LAB — LIPID PANEL
Chol/HDL Ratio: 3.2 ratio (ref 0.0–4.4)
Cholesterol, Total: 228 mg/dL — ABNORMAL HIGH (ref 100–199)
HDL: 71 mg/dL (ref >39)
LDL Chol Calc (NIH): 137 mg/dL — ABNORMAL HIGH (ref 0–99)
Triglycerides: 113 mg/dL (ref 0–149)
VLDL Cholesterol Cal: 20 mg/dL (ref 5–40)

## 2023-11-17 NOTE — Assessment & Plan Note (Signed)
 Chronic, controlled.  EKG performed, NSR w/ combined atrial enlargement.  She will continue with lisinopril/hct 20/12.5mg  daily. She is encouraged to follow low sodium diet. She will f/u in six months for re-evaluation.

## 2023-11-17 NOTE — Assessment & Plan Note (Signed)
 She is reminded to stop eating 3 hrs prior to lying down. She will continue with famotidine daily as needed. She should also pay attention to her diet to determine which foods trigger her sx.

## 2023-11-17 NOTE — Assessment & Plan Note (Signed)
 She was given handicapped sticker as requested. Form was completed during her visit.

## 2023-11-17 NOTE — Assessment & Plan Note (Signed)
 Previous labs reviewed, her A1c has been elevated in the past. I will check an A1c today. Reminded to avoid refined sugars including sugary drinks/foods and processed meats including bacon, sausages and deli meats.

## 2023-11-17 NOTE — Assessment & Plan Note (Signed)
 She is aware of 4 lb weight gain. Importance of regular exercise was discussed with the patient. She was recently re-approved for [facility]. We will gradually titrate her back up to 1.7mg .  She will help to guide how quickly we titrate her dose. She is encouraged to be intentional about her protein intake to decrease risk of losing muscle mass. She will f/u in 8-10 weeks.

## 2023-11-17 NOTE — Assessment & Plan Note (Signed)
Chronic, sx stable with escitalopram. Sx likely exacerbated by hormonal changes associated with perimenopause.

## 2023-11-30 ENCOUNTER — Other Ambulatory Visit: Payer: Self-pay | Admitting: Internal Medicine

## 2023-12-02 MED ORDER — WEGOVY 0.5 MG/0.5ML ~~LOC~~ SOAJ: 0.5000 mg | SUBCUTANEOUS | 0 refills | Status: DC

## 2024-02-16 ENCOUNTER — Ambulatory Visit: Admitting: Internal Medicine

## 2024-02-17 ENCOUNTER — Ambulatory Visit: Admitting: Family Medicine

## 2024-02-17 ENCOUNTER — Encounter: Payer: Self-pay | Admitting: Family Medicine

## 2024-02-17 VITALS — BP 118/74 | HR 76 | Temp 98.4°F | Ht 69.0 in | Wt 252.4 lb

## 2024-02-17 DIAGNOSIS — R7303 Prediabetes: Secondary | ICD-10-CM | POA: Diagnosis not present

## 2024-02-17 DIAGNOSIS — E66812 Obesity, class 2: Secondary | ICD-10-CM | POA: Diagnosis not present

## 2024-02-17 DIAGNOSIS — E782 Mixed hyperlipidemia: Secondary | ICD-10-CM

## 2024-02-17 DIAGNOSIS — Z6837 Body mass index (BMI) 37.0-37.9, adult: Secondary | ICD-10-CM

## 2024-02-17 DIAGNOSIS — E559 Vitamin D deficiency, unspecified: Secondary | ICD-10-CM

## 2024-02-17 DIAGNOSIS — F50811 Binge eating disorder, moderate: Secondary | ICD-10-CM

## 2024-02-17 MED ORDER — PHENTERMINE-TOPIRAMATE ER 7.5-46 MG PO CP24: 1.0000 | ORAL_CAPSULE | Freq: Every day | ORAL | 1 refills | Status: DC

## 2024-02-17 NOTE — Patient Instructions (Signed)

## 2024-02-17 NOTE — Progress Notes (Signed)
 I,[name], CMA,acting as a Neurosurgeon for [employer], NP.,have documented all relevant documentation on the behalf of Melodie Spry, NP,as directed by  Melodie Spry, NP while in the presence of Melodie Spry, NP.  Subjective:  Patient ID: Jody [name] , female    DOB: Jun 10, 1971 , 53 y.o.   MRN: [id number]  Chief Complaint  Patient presents with   Weight Loss    Patient presents today for a weight check. Patient doesn't have any specific questions or concerns. Patient denies headaches, chest pain & sob     HPI  Patient is a 53 year old who presents today for  weight management. Patient doesn't have any specific questions or concerns. Patient denies headaches, chest pain & shortness.     Past Medical History:  Diagnosis Date   H/O amenorrhea    History of chicken pox    Hx of mumps    Hyperlipidemia    Hypertension    Low iron    Prediabetes    Yeast infection      Family History  Problem Relation Age of Onset   Hypertension Mother    Diabetes Mother    Hypertension Maternal Grandmother    Eczema Daughter      Current Outpatient Medications:    escitalopram  (LEXAPRO ) 10 MG tablet, Take 1 tablet (10 mg total) by mouth daily., Disp: 90 tablet, Rfl: 1   famotidine  (PEPCID ) 20 MG tablet, TAKE 1 TABLET BY MOUTH EVERY DAY AS NEEDED, Disp: 90 tablet, Rfl: 1   lisinopril -hydrochlorothiazide  (ZESTORETIC ) 20-12.5 MG tablet, Take 1 tablet by mouth daily., Disp: 90 tablet, Rfl: 2   norethindrone-ethinyl estradiol (FEMHRT 1/5) 1-5 MG-MCG TABS tablet, Take by mouth daily., Disp: , Rfl:    Phentermine -Topiramate  (QSYMIA ) 7.5-46 MG CP24, Take 1 capsule by mouth daily., Disp: 30 capsule, Rfl: 1   Semaglutide -Weight Management (WEGOVY ) 0.5 MG/0.5ML SOAJ, Inject 0.5 mg into the skin once a week., Disp: 2 mL, Rfl: 0   atorvastatin (LIPITOR) 10 MG tablet, Take 1 tablet (10 mg total) by mouth daily., Disp: 30 tablet, Rfl: 11   Vitamin D , Ergocalciferol , (DRISDOL ) 1.25 MG (50000 UNIT)  CAPS capsule, Take 1 capsule (50,000 Units total) by mouth every 7 (seven) days., Disp: 4 capsule, Rfl: 0   No Known Allergies   Review of Systems  Constitutional: Negative.   HENT: Negative.    Respiratory: Negative.    Genitourinary: Negative.   Musculoskeletal: Negative.   Skin: Negative.   Neurological: Negative.      Today's Vitals   02/17/24 1006  BP: 118/74  Pulse: 76  Temp: 98.4 F (36.9 C)  SpO2: 98%  Weight: 252 lb 6.4 oz (114.5 kg)  Height: 5' 9 (1.753 m)   Body mass index is 37.27 kg/m.  Wt Readings from Last 3 Encounters:  02/17/24 252 lb 6.4 oz (114.5 kg)  11/16/23 246 lb 3.2 oz (111.7 kg)  08/03/23 242 lb (109.8 kg)    The 10-year ASCVD risk score (Arnett DK, et al., 2019) is: 2.2%   Values used to calculate the score:     Age: 68 years     Clincally relevant sex: Female     Is Non-Hispanic African American: Yes     Diabetic: No     Tobacco smoker: No     Systolic Blood Pressure: 118 mmHg     Is BP treated: Yes     HDL Cholesterol: 65 mg/dL     Total Cholesterol: 208 mg/dL  Objective:  Physical Exam HENT:     Head: Normocephalic.   Cardiovascular:     Rate and Rhythm: Normal rate.  Pulmonary:     Effort: Pulmonary effort is normal.     Breath sounds: Normal breath sounds.   Musculoskeletal:     Cervical back: Normal range of motion.   Neurological:     Mental Status: She is alert.         Assessment And Plan:  Class 2 obesity without serious comorbidity with body mass index (BMI) of 37.0 to 37.9 in adult, unspecified obesity type Assessment & Plan: She is encouraged to strive for BMI less than 30 to decrease cardiac risk. Advised to aim for at least 150 minutes of exercise per week.   Orders: -     BMP8+eGFR -     CBC with Differential/Platelet  Moderate binge-eating disorder -     Phentermine -Topiramate  ER; Take 1 capsule by mouth daily.  Dispense: 30 capsule; Refill: 1  Mixed hyperlipidemia Assessment & Plan: Low fat diet  advised. Atorvastatin 10 mg every day.  Orders: -     Lipid panel  Prediabetes Assessment & Plan: Check A1c.  Orders: -     Hemoglobin A1c  Vitamin D  deficiency Assessment & Plan: Check levels  Orders: -     VITAMIN D  25 Hydroxy (Vit-D Deficiency, Fractures)    Return if symptoms worsen or fail to improve, for 2 month weight check.  Patient was given opportunity to ask questions. Patient verbalized understanding of the plan and was able to repeat key elements of the plan. All questions were answered to their satisfaction.    I, Melodie Spry, NP, have reviewed all documentation for this visit. The documentation on 02/27/2024 for the exam, diagnosis, procedures, and orders are all accurate and complete.    IF YOU HAVE BEEN REFERRED TO A SPECIALIST, IT MAY TAKE 1-2 WEEKS TO SCHEDULE/PROCESS THE REFERRAL. IF YOU HAVE NOT HEARD FROM US /SPECIALIST IN TWO WEEKS, PLEASE GIVE US  A CALL AT [phone].

## 2024-02-18 LAB — LIPID PANEL
Chol/HDL Ratio: 3.2 ratio (ref 0.0–4.4)
Cholesterol, Total: 208 mg/dL — ABNORMAL HIGH (ref 100–199)
HDL: 65 mg/dL (ref >39)
LDL Chol Calc (NIH): 117 mg/dL — ABNORMAL HIGH (ref 0–99)
Triglycerides: 148 mg/dL (ref 0–149)
VLDL Cholesterol Cal: 26 mg/dL (ref 5–40)

## 2024-02-18 LAB — CBC WITH DIFFERENTIAL/PLATELET
Basophils Absolute: 0 10*3/uL (ref 0.0–0.2)
Basos: 0 %
EOS (ABSOLUTE): 0.1 10*3/uL (ref 0.0–0.4)
Eos: 1 %
Hematocrit: 38.8 % (ref 34.0–46.6)
Hemoglobin: 12.3 g/dL (ref 11.1–15.9)
Immature Grans (Abs): 0 10*3/uL (ref 0.0–0.1)
Immature Granulocytes: 0 %
Lymphocytes Absolute: 3.1 10*3/uL (ref 0.7–3.1)
Lymphs: 45 %
MCH: 26.4 pg — ABNORMAL LOW (ref 26.6–33.0)
MCHC: 31.7 g/dL (ref 31.5–35.7)
MCV: 83 fL (ref 79–97)
Monocytes Absolute: 0.4 10*3/uL (ref 0.1–0.9)
Monocytes: 6 %
Neutrophils Absolute: 3.3 10*3/uL (ref 1.4–7.0)
Neutrophils: 48 %
Platelets: 377 10*3/uL (ref 150–450)
RBC: 4.66 x10E6/uL (ref 3.77–5.28)
RDW: 12.8 % (ref 11.7–15.4)
WBC: 6.9 10*3/uL (ref 3.4–10.8)

## 2024-02-18 LAB — BMP8+EGFR
BUN/Creatinine Ratio: 13 (ref 9–23)
BUN: 10 mg/dL (ref 6–24)
CO2: 21 mmol/L (ref 20–29)
Calcium: 9.6 mg/dL (ref 8.7–10.2)
Chloride: 105 mmol/L (ref 96–106)
Creatinine, Ser: 0.78 mg/dL (ref 0.57–1.00)
Glucose: 91 mg/dL (ref 70–99)
Potassium: 4.4 mmol/L (ref 3.5–5.2)
Sodium: 141 mmol/L (ref 134–144)
eGFR: 92 mL/min/{1.73_m2} (ref >59)

## 2024-02-18 LAB — VITAMIN D 25 HYDROXY (VIT D DEFICIENCY, FRACTURES): Vit D, 25-Hydroxy: 22.6 ng/mL — ABNORMAL LOW (ref 30.0–100.0)

## 2024-02-18 LAB — HEMOGLOBIN A1C
Est. average glucose Bld gHb Est-mCnc: 131 mg/dL
Hgb A1c MFr Bld: 6.2 % — ABNORMAL HIGH (ref 4.8–5.6)

## 2024-02-27 ENCOUNTER — Ambulatory Visit: Payer: Self-pay | Admitting: Family Medicine

## 2024-02-27 MED ORDER — ATORVASTATIN CALCIUM 10 MG PO TABS: 10.0000 mg | ORAL_TABLET | Freq: Every day | ORAL | 11 refills | Status: AC

## 2024-02-27 MED ORDER — VITAMIN D (ERGOCALCIFEROL) 1.25 MG (50000 UNIT) PO CAPS
50000.0000 [IU] | ORAL_CAPSULE | ORAL | 0 refills | Status: DC
Start: 2024-02-27 — End: 2024-03-26

## 2024-02-27 NOTE — Assessment & Plan Note (Signed)
 Check levels

## 2024-02-27 NOTE — Progress Notes (Signed)
 Your vitamin D  is  low, sent you 1 month supply of once weekly vitamin D  supplemnet. Your A1c dropped to 6.2 for 6.4 which is good but this is still in the prediabetic range and your cholesterol dropped by 20 pointsLow carbs, low fat diet advised . I see you are not on cholesterol medication. I sent atorvastatin to the pharmacy for cholesterol for you. Take one daily.  Thanks

## 2024-02-27 NOTE — Assessment & Plan Note (Signed)
 Check A1c.

## 2024-02-27 NOTE — Assessment & Plan Note (Signed)
 She is encouraged to strive for BMI less than 30 to decrease cardiac risk. Advised to aim for at least 150 minutes of exercise per week.

## 2024-02-27 NOTE — Assessment & Plan Note (Signed)
 Low fat diet advised. Atorvastatin 10 mg every day.

## 2024-03-25 ENCOUNTER — Other Ambulatory Visit: Payer: Self-pay | Admitting: Family Medicine

## 2024-04-26 DIAGNOSIS — Z01411 Encounter for gynecological examination (general) (routine) with abnormal findings: Secondary | ICD-10-CM | POA: Diagnosis not present

## 2024-04-26 DIAGNOSIS — Z1231 Encounter for screening mammogram for malignant neoplasm of breast: Secondary | ICD-10-CM | POA: Diagnosis not present

## 2024-04-26 DIAGNOSIS — Z6837 Body mass index (BMI) 37.0-37.9, adult: Secondary | ICD-10-CM | POA: Diagnosis not present

## 2024-04-26 DIAGNOSIS — Z01419 Encounter for gynecological examination (general) (routine) without abnormal findings: Secondary | ICD-10-CM | POA: Diagnosis not present

## 2024-04-26 DIAGNOSIS — Z124 Encounter for screening for malignant neoplasm of cervix: Secondary | ICD-10-CM | POA: Diagnosis not present

## 2024-04-26 LAB — HM MAMMOGRAPHY

## 2024-05-02 LAB — HM PAP SMEAR

## 2024-05-21 ENCOUNTER — Ambulatory Visit: Admitting: Internal Medicine

## 2024-05-21 VITALS — BP 120/80 | HR 72 | Temp 98.7°F | Ht 69.0 in | Wt 252.0 lb

## 2024-05-21 DIAGNOSIS — E782 Mixed hyperlipidemia: Secondary | ICD-10-CM | POA: Diagnosis not present

## 2024-05-21 DIAGNOSIS — R7303 Prediabetes: Secondary | ICD-10-CM

## 2024-05-21 DIAGNOSIS — Z6837 Body mass index (BMI) 37.0-37.9, adult: Secondary | ICD-10-CM

## 2024-05-21 DIAGNOSIS — F419 Anxiety disorder, unspecified: Secondary | ICD-10-CM | POA: Diagnosis not present

## 2024-05-21 DIAGNOSIS — I10 Essential (primary) hypertension: Secondary | ICD-10-CM

## 2024-05-21 DIAGNOSIS — Z23 Encounter for immunization: Secondary | ICD-10-CM | POA: Diagnosis not present

## 2024-05-21 DIAGNOSIS — F50811 Binge eating disorder, moderate: Secondary | ICD-10-CM

## 2024-05-21 DIAGNOSIS — E66812 Obesity, class 2: Secondary | ICD-10-CM

## 2024-05-21 MED ORDER — LISINOPRIL-HYDROCHLOROTHIAZIDE 20-12.5 MG PO TABS: 1.0000 | ORAL_TABLET | Freq: Every day | ORAL | 2 refills | Status: AC

## 2024-05-21 MED ORDER — LISDEXAMFETAMINE DIMESYLATE 30 MG PO CAPS: 30.0000 mg | ORAL_CAPSULE | Freq: Every day | ORAL | 0 refills | Status: AC

## 2024-05-21 MED ORDER — ESCITALOPRAM OXALATE 10 MG PO TABS: 10.0000 mg | ORAL_TABLET | Freq: Every day | ORAL | 1 refills | Status: DC

## 2024-05-21 NOTE — Assessment & Plan Note (Signed)
 Chronic, controlled.  She will continue with lisinopril /hct 20/12.5mg  daily. She is encouraged to follow low sodium diet. She will f/u in six months for re-evaluation.

## 2024-05-21 NOTE — Assessment & Plan Note (Signed)
 Chronic, she has requested to resume treatment.  Previous adverse reaction to higher doses of Vyvanse . Restarted Vyvanse  at 30 mg with close monitoring. - Prescribe Vyvanse  30 mg daily. - Schedule follow-up in 8 weeks to assess Vyvanse  efficacy and tolerance.

## 2024-05-21 NOTE — Assessment & Plan Note (Signed)
 Increased anxiety with full dose of escitalopram . Discontinued due to incorrect usage and preference for non-pharmacological management. - Discontinue escitalopram .

## 2024-05-21 NOTE — Patient Instructions (Signed)
 Hypertension, Adult Hypertension is another name for high blood pressure. High blood pressure forces your heart to work harder to pump blood. This can cause problems over time. There are two numbers in a blood pressure reading. There is a top number (systolic) over a bottom number (diastolic). It is best to have a blood pressure that is below 120/80. What are the causes? The cause of this condition is not known. Some other conditions can lead to high blood pressure. What increases the risk? Some lifestyle factors can make you more likely to develop high blood pressure: Smoking. Not getting enough exercise or physical activity. Being overweight. Having too much fat, sugar, calories, or salt (sodium) in your diet. Drinking too much alcohol. Other risk factors include: Having any of these conditions: Heart disease. Diabetes. High cholesterol. Kidney disease. Obstructive sleep apnea. Having a family history of high blood pressure and high cholesterol. Age. The risk increases with age. Stress. What are the signs or symptoms? High blood pressure may not cause symptoms. Very high blood pressure (hypertensive crisis) may cause: Headache. Fast or uneven heartbeats (palpitations). Shortness of breath. Nosebleed. Vomiting or feeling like you may vomit (nauseous). Changes in how you see. Very bad chest pain. Feeling dizzy. Seizures. How is this treated? This condition is treated by making healthy lifestyle changes, such as: Eating healthy foods. Exercising more. Drinking less alcohol. Your doctor may prescribe medicine if lifestyle changes do not help enough and if: Your top number is above 130. Your bottom number is above 80. Your personal target blood pressure may vary. Follow these instructions at home: Eating and drinking  If told, follow the DASH eating plan. To follow this plan: Fill one half of your plate at each meal with fruits and vegetables. Fill one fourth of your plate  at each meal with whole grains. Whole grains include whole-wheat pasta, brown rice, and whole-grain bread. Eat or drink low-fat dairy products, such as skim milk or low-fat yogurt. Fill one fourth of your plate at each meal with low-fat (lean) proteins. Low-fat proteins include fish, chicken without skin, eggs, beans, and tofu. Avoid fatty meat, cured and processed meat, or chicken with skin. Avoid pre-made or processed food. Limit the amount of salt in your diet to less than 1,500 mg each day. Do not drink alcohol if: Your doctor tells you not to drink. You are pregnant, may be pregnant, or are planning to become pregnant. If you drink alcohol: Limit how much you have to: 0-1 drink a day for women. 0-2 drinks a day for men. Know how much alcohol is in your drink. In the [place] one drink equals one 12 oz bottle of beer (355 mL), one 5 oz glass of wine (148 mL), or one 1 oz glass of hard liquor (44 mL). Lifestyle  Work with your doctor to stay at a healthy weight or to lose weight. Ask your doctor what the best weight is for you. Get at least 30 minutes of exercise that causes your heart to beat faster (aerobic exercise) most days of the week. This may include walking, swimming, or biking. Get at least 30 minutes of exercise that strengthens your muscles (resistance exercise) at least 3 days a week. This may include lifting weights or doing Pilates. Do not smoke or use any products that contain nicotine or tobacco. If you need help quitting, ask your doctor. Check your blood pressure at home as told by your doctor. Keep all follow-up visits. Medicines Take over-the-counter and prescription medicines  only as told by your doctor. Follow directions carefully. Do not skip doses of blood pressure medicine. The medicine does not work as well if you skip doses. Skipping doses also puts you at risk for problems. Ask your doctor about side effects or reactions to medicines that you should watch  for. Contact a doctor if: You think you are having a reaction to the medicine you are taking. You have headaches that keep coming back. You feel dizzy. You have swelling in your ankles. You have trouble with your vision. Get help right away if: You get a very bad headache. You start to feel mixed up (confused). You feel weak or numb. You feel faint. You have very bad pain in your: Chest. Belly (abdomen). You vomit more than once. You have trouble breathing. These symptoms may be an emergency. Get help right away. Call 911. Do not wait to see if the symptoms will go away. Do not drive yourself to the hospital. Summary Hypertension is another name for high blood pressure. High blood pressure forces your heart to work harder to pump blood. For most people, a normal blood pressure is less than 120/80. Making healthy choices can help lower blood pressure. If your blood pressure does not get lower with healthy choices, you may need to take medicine. This information is not intended to replace advice given to you by your health care provider. Make sure you discuss any questions you have with your health care provider. Document Revised: 06/18/2021 Document Reviewed: 06/18/2021 Elsevier Patient Education  2024 [employer].

## 2024-05-21 NOTE — Assessment & Plan Note (Signed)
 Participating in a nutritionist-led weight loss program focusing on diet and exercise. - Encourage continuation of the program and gradual increase in exercise.

## 2024-05-21 NOTE — Assessment & Plan Note (Addendum)
 Chronic, last reading improved with LDL 117 in June 2025. Cholesterol levels improved with dietary changes. Discussed genetic predisposition and lipoprotein A's role in risk assessment. - Order lipoprotein A test. - Evaluate lipoprotein A results to determine need for statin therapy.

## 2024-05-21 NOTE — Assessment & Plan Note (Signed)
 Previous labs reviewed, her A1c has been elevated in the past. I will check an A1c today. Reminded to avoid refined sugars including sugary drinks/foods and processed meats including bacon, sausages and deli meats.

## 2024-05-21 NOTE — Progress Notes (Signed)
 I,Jody [name], CMA,acting as a Neurosurgeon for Jody LOISE Slocumb, MD.,have documented all relevant documentation on the behalf of Jody LOISE Slocumb, MD,as directed by  Jody LOISE Slocumb, MD while in the presence of Jody LOISE Slocumb, MD.  Subjective:  Patient ID: Jody [name] Husband , female    DOB: 21-Sep-1970 , 53 y.o.   MRN: [id number]  Chief Complaint  Patient presents with   Hypertension    Patient presents today for bp, predm & cholesterol follow up. She reports compliance with medications. Denies headache, chest pain & sob.  She would like to further discuss weight loss options. Previously she has tried to get on Qsymia  or Wegovy  which neither are covered.  Letter sent for mammogram.    Prediabetes   Hyperlipidemia    HPI Discussed the use of AI scribe software for clinical note transcription with the patient, who gave verbal consent to proceed.  History of Present Illness [name] Moni Rothrock is a 53 year old female who presents for a blood pressure check.  She is currently taking lisinopril , which is due for a refill. She is also on birth control prescribed by her gynecologist.  She discusses her cholesterol management, noting a 20-point drop in cholesterol levels after dietary changes, specifically reducing carbohydrates, fatty foods, and fried foods, without starting a statin. She has not been exercising consistently and is not taking a fiber supplement.  She is taking escitalopram  10 mg daily but has been using it incorrectly as an as-needed medication. She feels more anxious when taking the full tablet compared to half. She is also taking famotidine  as needed but does not require it at present.  She has a history of binge eating disorder and previously used Vyvanse , which she stopped due to adverse reactions. She is interested in restarting Vyvanse  at a lower dose. 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  She mentions significant changes in her mood and mental state around her 51th birthday, attributing it to menopause. She reports not experiencing many hot flashes, likely due to her birth control use, but notes significant mental health challenges during this period. Her mood is currently stable.   Hypertension This is a chronic problem. The current episode started more than 1 year ago. The problem is unchanged. The problem is controlled. Pertinent negatives include no chest pain, headaches, orthopnea, palpitations or shortness of breath. There are no associated agents to hypertension. Risk factors for coronary artery disease include obesity and sedentary lifestyle. The current treatment provides moderate improvement. Compliance problems include exercise.      Past Medical History:  Diagnosis Date   H/O amenorrhea    History of chicken pox    Hx of mumps    Hyperlipidemia    Hypertension    Low iron    Prediabetes    Yeast infection      Family History  Problem Relation Age of Onset   Hypertension Mother    Diabetes Mother    Hypertension Maternal Grandmother    Eczema Daughter      Current Outpatient Medications:    atorvastatin  (LIPITOR) 10 MG tablet, Take 1 tablet (10 mg total) by mouth daily., Disp: 30 tablet, Rfl: 11   famotidine  (PEPCID ) 20 MG tablet, TAKE 1 TABLET BY MOUTH EVERY DAY AS NEEDED, Disp: 90 tablet, Rfl: 1   lisdexamfetamine (VYVANSE ) 30 MG capsule, Take 1 capsule (30 mg total)  by mouth daily., Disp: 30 capsule, Rfl: 0   norethindrone-ethinyl estradiol (FEMHRT 1/5) 1-5 MG-MCG TABS tablet, Take by mouth daily., Disp: , Rfl:    Vitamin D , Ergocalciferol , (DRISDOL ) 1.25 MG (50000 UNIT) CAPS capsule, TAKE 1 CAPSULE (50,000 UNITS TOTAL) BY MOUTH EVERY 7 (SEVEN) DAYS, Disp: 12 capsule, Rfl: 1   lisinopril -hydrochlorothiazide  (ZESTORETIC ) 20-12.5 MG tablet, Take 1 tablet by mouth daily., Disp: 90 tablet, Rfl: 2   No Known  Allergies   Review of Systems  Constitutional: Negative.   Respiratory: Negative.  Negative for shortness of breath.   Cardiovascular: Negative.  Negative for chest pain, palpitations and orthopnea.  Gastrointestinal: Negative.   Neurological: Negative.  Negative for headaches.  Psychiatric/Behavioral: Negative.       Today's Vitals   05/21/24 0944  BP: 120/80  Pulse: 72  Temp: 98.7 F (37.1 C)  SpO2: 98%  Weight: 252 lb (114.3 kg)  Height: 5' 9 (1.753 m)   Body mass index is 37.21 kg/m.  Wt Readings from Last 3 Encounters:  05/21/24 252 lb (114.3 kg)  02/17/24 252 lb 6.4 oz (114.5 kg)  11/16/23 246 lb 3.2 oz (111.7 kg)     Objective:  Physical Exam Vitals and nursing note reviewed.  Constitutional:      Appearance: Normal appearance.  HENT:     Head: Normocephalic and atraumatic.  Eyes:     Extraocular Movements: Extraocular movements intact.  Cardiovascular:     Rate and Rhythm: Normal rate and regular rhythm.     Heart sounds: Normal heart sounds.  Pulmonary:     Effort: Pulmonary effort is normal.     Breath sounds: Normal breath sounds.  Musculoskeletal:     Cervical back: Normal range of motion.  Skin:    General: Skin is warm.  Neurological:     General: No focal deficit present.     Mental Status: She is alert.  Psychiatric:        Mood and Affect: Mood normal.        Behavior: Behavior normal.         Assessment And Plan:  Essential hypertension, benign Assessment & Plan: Chronic, controlled.  She will continue with lisinopril /hct 20/12.5mg  daily. She is encouraged to follow low sodium diet. She will f/u in six months for re-evaluation.   Orders: -     CMP14+EGFR; Future -     Lisinopril -hydroCHLOROthiazide ; Take 1 tablet by mouth daily.  Dispense: 90 tablet; Refill: 2  Prediabetes Assessment & Plan: Previous labs reviewed, her A1c has been elevated in the past. I will check an A1c today. Reminded to avoid refined sugars including sugary  drinks/foods and processed meats including bacon, sausages and deli meats.    Orders: -     CMP14+EGFR; Future -     Hemoglobin A1c; Future  Mixed hyperlipidemia Assessment & Plan: Chronic, last reading improved with LDL 117 in June 2025. Cholesterol levels improved with dietary changes. Discussed genetic predisposition and lipoprotein A's role in risk assessment. - Order lipoprotein A test. - Evaluate lipoprotein A results to determine need for statin therapy.  Orders: -     Lipoprotein A (LPA); Future  Anxiety Assessment & Plan: Increased anxiety with full dose of escitalopram . Discontinued due to incorrect usage and preference for non-pharmacological management. - Discontinue escitalopram .   Moderate binge-eating disorder Assessment & Plan: Chronic, she has requested to resume treatment.  Previous adverse reaction to higher doses of Vyvanse . Restarted Vyvanse  at 30 mg with close monitoring. - Prescribe  Vyvanse  30 mg daily. - Schedule follow-up in 8 weeks to assess Vyvanse  efficacy and tolerance.   Class 2 severe obesity due to excess calories with serious comorbidity and body mass index (BMI) of 37.0 to 37.9 in adult [facility]) Assessment & Plan: Participating in a nutritionist-led weight loss program focusing on diet and exercise. - Encourage continuation of the program and gradual increase in exercise.   Immunization due -     Flu vaccine trivalent PF, 6mos and older(Flulaval,Afluria,Fluarix,Fluzone)  Other orders -     Lisdexamfetamine Dimesylate ; Take 1 capsule (30 mg total) by mouth daily.  Dispense: 30 capsule; Refill: 0    Return in 8 weeks (on 07/16/2024), or med check- Vyvanse .  Patient was given opportunity to ask questions. Patient verbalized understanding of the plan and was able to repeat key elements of the plan. All questions were answered to their satisfaction.   I, Jody LOISE Slocumb, MD, have reviewed all documentation for this visit. The documentation on  05/21/24 for the exam, diagnosis, procedures, and orders are all accurate and complete.   IF YOU HAVE BEEN REFERRED TO A SPECIALIST, IT MAY TAKE 1-2 WEEKS TO SCHEDULE/PROCESS THE REFERRAL. IF YOU HAVE NOT HEARD FROM US /SPECIALIST IN TWO WEEKS, PLEASE GIVE US  A CALL AT [phone].   THE PATIENT IS ENCOURAGED TO PRACTICE SOCIAL DISTANCING DUE TO THE COVID-19 PANDEMIC.

## 2024-06-08 ENCOUNTER — Other Ambulatory Visit

## 2024-06-08 DIAGNOSIS — R7303 Prediabetes: Secondary | ICD-10-CM

## 2024-06-08 DIAGNOSIS — I10 Essential (primary) hypertension: Secondary | ICD-10-CM | POA: Diagnosis not present

## 2024-06-08 DIAGNOSIS — E782 Mixed hyperlipidemia: Secondary | ICD-10-CM | POA: Diagnosis not present

## 2024-06-12 LAB — CMP14+EGFR
ALT: 9 IU/L (ref 0–32)
AST: 13 IU/L (ref 0–40)
Albumin: 3.9 g/dL (ref 3.8–4.9)
Alkaline Phosphatase: 90 IU/L (ref 49–135)
BUN/Creatinine Ratio: 9 (ref 9–23)
BUN: 7 mg/dL (ref 6–24)
Bilirubin Total: 0.3 mg/dL (ref 0.0–1.2)
CO2: 21 mmol/L (ref 20–29)
Calcium: 9.5 mg/dL (ref 8.7–10.2)
Chloride: 103 mmol/L (ref 96–106)
Creatinine, Ser: 0.81 mg/dL (ref 0.57–1.00)
Globulin, Total: 3 g/dL (ref 1.5–4.5)
Glucose: 83 mg/dL (ref 70–99)
Potassium: 4.4 mmol/L (ref 3.5–5.2)
Sodium: 139 mmol/L (ref 134–144)
Total Protein: 6.9 g/dL (ref 6.0–8.5)
eGFR: 88 mL/min/1.73 (ref >59)

## 2024-06-12 LAB — HEMOGLOBIN A1C
Est. average glucose Bld gHb Est-mCnc: 131 mg/dL
Hgb A1c MFr Bld: 6.2 % — ABNORMAL HIGH (ref 4.8–5.6)

## 2024-06-12 LAB — LIPOPROTEIN A (LPA): Lipoprotein (a): 50.1 nmol/L (ref <75.0)

## 2024-06-16 ENCOUNTER — Ambulatory Visit: Payer: Self-pay | Admitting: Internal Medicine

## 2024-07-19 ENCOUNTER — Ambulatory Visit (INDEPENDENT_AMBULATORY_CARE_PROVIDER_SITE_OTHER): Payer: Self-pay | Admitting: Internal Medicine

## 2024-07-19 DIAGNOSIS — F419 Anxiety disorder, unspecified: Secondary | ICD-10-CM

## 2024-07-19 NOTE — Progress Notes (Signed)
 Erroneous encounter, appt cancelled.

## 2024-07-19 NOTE — Patient Instructions (Signed)

## 2024-09-29 ENCOUNTER — Other Ambulatory Visit: Payer: Self-pay | Admitting: Family Medicine

## 2024-11-26 ENCOUNTER — Encounter: Payer: Self-pay | Admitting: Internal Medicine
